# Patient Record
Sex: Male | Born: 1970 | Race: White | Hispanic: No | Marital: Married | Smoking: Former smoker
Health system: Southern US, Community
[De-identification: ages and names within clinical notes are randomized; demographics above are authoritative.]

## PROBLEM LIST (undated history)

## (undated) HISTORY — PX: HERNIA REPAIR: SHX51

---

## 2005-11-16 ENCOUNTER — Ambulatory Visit (HOSPITAL_COMMUNITY): Admission: RE | Admit: 2005-11-16 | Discharge: 2005-11-16 | Payer: Self-pay | Admitting: General Surgery

## 2007-07-04 ENCOUNTER — Ambulatory Visit: Payer: Self-pay | Admitting: Orthopedic Surgery

## 2007-07-04 DIAGNOSIS — M25569 Pain in unspecified knee: Secondary | ICD-10-CM | POA: Insufficient documentation

## 2007-07-07 ENCOUNTER — Telehealth: Payer: Self-pay | Admitting: Orthopedic Surgery

## 2010-11-14 NOTE — H&P (Signed)
Christopher Bush, Christopher Bush                 ACCOUNT NO.:  [id number]   MEDICAL RECORD NO.:  [id number]          PATIENT TYPE:  AMB   LOCATION:                                FACILITY:  [facility]   PHYSICIAN:  Dalia Heading, M.D.  DATE OF BIRTH:  09/20/2003   DATE OF ADMISSION:  11/16/2005  DATE OF DISCHARGE:  LH                                HISTORY & PHYSICAL   CHIEF COMPLAINT:  Recurrent cyst, left ear.   HISTORY OF PRESENT ILLNESS:  The patient is a 40 year old white male who  presents with a recurrent cyst behind his left ear.  He has had 2 attempts  at removal by another surgeon in his office in the past.  The cyst has  recurred.   PAST MEDICAL HISTORY:  Unremarkable.   PAST SURGICAL HISTORY:  As noted above.   CURRENT MEDICATIONS:  Keflex.   ALLERGIES:  No known drug allergies.   REVIEW OF SYSTEMS:  Noncontributory.   PHYSICAL EXAMINATION:  GENERAL:  On physical examination, the patient is a  well-developed, well-nourished white male in no acute distress.  LUNGS:  Clear to auscultation with equal breath sounds bilaterally.  HEART EXAMINATION:  Reveals A regular rate and rhythm without S3-S4 or  murmurs.  HEENT EXAMINATION:  Reveals a 1.5 cm inflamed cyst on the posterior aspect  of the left ear.  Erythema is noted.  No drainage is noted.   IMPRESSION:  Recurrent cyst, left ear.   PLAN:  The patient is scheduled for excision of the recurrent cyst, left ear  on 11/16/2005.  The risks and benefits of the procedure including recurrence  of the cyst were fully explained to the patient, who gave informed consent.      Dalia Heading, M.D.  Electronically Signed     MAJ/MEDQ  D:  11/10/2005  T:  11/10/2005  Job:  [id number]   cc:   [facility]  Fax: [phone]   Patrica Duel, M.D.  Fax: [phone]

## 2010-11-14 NOTE — Op Note (Signed)
NAMEGEORDIE, Christopher Bush                 ACCOUNT NO.:  [id number]   MEDICAL RECORD NO.:  [id number]          PATIENT TYPE:  AMB   LOCATION:  DAY                           FACILITY:  [facility]   PHYSICIAN:  Dalia Heading, M.D.  DATE OF BIRTH:  1970/07/10   DATE OF PROCEDURE:  11/16/2005  DATE OF DISCHARGE:                                 OPERATIVE REPORT   AGE:  40 years old.   PREOPERATIVE DIAGNOSIS:  Recurrent cyst, left ear.   POSTOPERATIVE DIAGNOSIS:  Recurrent cyst, left ear.   PROCEDURE:  Excision of recurrent cyst, left ear.   SURGEON:  Dr. Franky Macho.   ANESTHESIA:  General endotracheal.   INDICATIONS:  The patient is a 40 year old white male, who has had recurrent  episodes of drainage from a cyst in the posterior auricular region of the  left earlobe.  Due to the recurrent nature and failure of previous removals,  the patient now comes to the operating room for a formal excision of the  recurrent cyst, left ear.  The risks and benefits of the procedure,  including bleeding, infection, and recurrence of the cyst were fully  explained to the patient, who gave informed consent.   PROCEDURE NOTE:  The patient was placed in the supine position.  After  induction of general endotracheal anesthesia, the left posterior auricular  region was prepped and draped using the usual sterile technique with  Betadine.  Surgical site confirmation was performed.   An elliptical incision was made over the region of the cyst in order to  remove excess tissue and scar tissue.  Cyst remnants were noted.  These were  curetted and removed without difficulty.  Any bleeding was controlled using  Bovie electrocautery.  Then, 0.5 % Sensorcaine was instilled into the  surrounding region.  The incision was closed using 5-0 nylon interrupted  sutures.  Neosporin ointment and a dry sterile dressing were applied.   All tape and needle counts were correct at the end of the procedure.  The  patient was  extubated in the operating room and went back to the recovery  room, awake and in stable condition.   COMPLICATIONS:  None.   SPECIMEN:  None.   BLOOD LOSS:  Minimal.      Dalia Heading, M.D.  Electronically Signed     MAJ/MEDQ  D:  11/16/2005  T:  11/16/2005  Job:  [id number]   cc:   Patrica Duel, M.D.  Fax: [phone]

## 2013-07-22 ENCOUNTER — Emergency Department (HOSPITAL_COMMUNITY)
Admission: EM | Admit: 2013-07-22 | Discharge: 2013-07-22 | Disposition: A | Discharge status: Home / Self Care | Payer: BC Managed Care – PPO | Attending: Emergency Medicine | Admitting: Emergency Medicine

## 2013-07-22 ENCOUNTER — Encounter (HOSPITAL_COMMUNITY): Payer: Self-pay | Admitting: Emergency Medicine

## 2013-07-22 DIAGNOSIS — R591 Generalized enlarged lymph nodes: Secondary | ICD-10-CM

## 2013-07-22 DIAGNOSIS — R599 Enlarged lymph nodes, unspecified: Secondary | ICD-10-CM | POA: Insufficient documentation

## 2013-07-22 DIAGNOSIS — Z79899 Other long term (current) drug therapy: Secondary | ICD-10-CM | POA: Insufficient documentation

## 2013-07-22 LAB — CBC WITH DIFFERENTIAL/PLATELET
Basophils Absolute: 0 10*3/uL (ref 0.0–0.1)
Basophils Relative: 0 % (ref 0–1)
Eosinophils Absolute: 0.1 10*3/uL (ref 0.0–0.7)
Eosinophils Relative: 1 % (ref 0–5)
HCT: 42.5 % (ref 39.0–52.0)
HEMOGLOBIN: 15.3 g/dL (ref 13.0–17.0)
LYMPHS ABS: 0.6 10*3/uL — AB (ref 0.7–4.0)
Lymphocytes Relative: 10 % — ABNORMAL LOW (ref 12–46)
MCH: 31.9 pg (ref 26.0–34.0)
MCHC: 36 g/dL (ref 30.0–36.0)
MCV: 88.7 fL (ref 78.0–100.0)
Monocytes Absolute: 0.6 10*3/uL (ref 0.1–1.0)
Monocytes Relative: 10 % (ref 3–12)
NEUTROS PCT: 79 % — AB (ref 43–77)
Neutro Abs: 4.8 10*3/uL (ref 1.7–7.7)
PLATELETS: 171 10*3/uL (ref 150–400)
RBC: 4.79 MIL/uL (ref 4.22–5.81)
RDW: 11.8 % (ref 11.5–15.5)
WBC: 6.1 10*3/uL (ref 4.0–10.5)

## 2013-07-22 LAB — MONONUCLEOSIS SCREEN: MONO SCREEN: NEGATIVE

## 2013-07-22 NOTE — ED Provider Notes (Signed)
Medical screening examination/treatment/procedure(s) were performed by non-physician practitioner and as supervising physician I was immediately available for consultation/collaboration.  EKG Interpretation   None         Glynn OctaveStephen Anselm Aumiller, MD 07/22/13 [phone]

## 2013-07-22 NOTE — ED Provider Notes (Signed)
CSN: [id number]     Arrival date & time 07/22/13  [id number] History   First MD Initiated Contact with Patient 07/22/13 [phone]     Chief Complaint  Patient presents with  . Sore Throat   (Consider location/radiation/quality/duration/timing/severity/associated sxs/prior Treatment) HPI Comments: Patient presents with complaint of neck swelling and sore throat for the past day. Preceding this, patient had upper respiratory tract infection with sore throat starting 9 days ago. Patient saw his doctor 1 week ago and was prescribed an antibiotic, cough medication, steroid shot. Over the next 3 days the patient's symptoms improved until yesterday. Patient denies fever, weight loss, night sweats, swelling in other locations. No shortness of breath or trouble breathing. Patient states that he feels pressure when he swallows. No regurgitation. Patient saw his primary care doctor today and received another steroid shot and prescription for prednisone which he has not started taking yet. Onset of symptoms gradual. Course is gradually worsening.  Patient is a 43 y.o. male presenting with pharyngitis. The history is provided by the patient.  Sore Throat Associated symptoms include a sore throat. Pertinent negatives include no abdominal pain, chest pain, coughing, fever, headaches, myalgias, nausea, rash or vomiting.    History reviewed. No pertinent past medical history. History reviewed. No pertinent past surgical history. No family history on file. History  Substance Use Topics  . Smoking status: Never Smoker   . Smokeless tobacco: Not on file  . Alcohol Use: No    Review of Systems  Constitutional: Negative for fever.  HENT: Positive for sore throat and trouble swallowing (pressure sensation). Negative for rhinorrhea.   Eyes: Negative for redness.  Respiratory: Negative for cough.   Cardiovascular: Negative for chest pain.  Gastrointestinal: Negative for nausea, vomiting, abdominal pain and diarrhea.   Genitourinary: Negative for dysuria.  Musculoskeletal: Negative for myalgias.  Skin: Negative for rash.  Neurological: Negative for headaches.  Hematological: Positive for adenopathy.    Allergies  Review of patient's allergies indicates no known allergies.  Home Medications   Current Outpatient Rx  Name  Route  Sig  Dispense  Refill  . Iron TABS   Oral   Take 1 tablet by mouth daily.          BP 133/92  Pulse 78  Temp(Src) 98.3 F (36.8 C) (Oral)  Resp 16  Ht 5\' 10"  (1.778 m)  Wt 192 lb (87.091 kg)  BMI 27.55 kg/m2  SpO2 100% Physical Exam  Nursing note and vitals reviewed. Constitutional: He appears well-developed and well-nourished.  HENT:  Head: Normocephalic and atraumatic.  Right Ear: Tympanic membrane, external ear and ear canal normal.  Left Ear: Tympanic membrane, external ear and ear canal normal.  Nose: Nose normal. No mucosal edema or rhinorrhea.  Mouth/Throat: Uvula is midline, oropharynx is clear and moist and mucous membranes are normal. Mucous membranes are not dry. No trismus in the jaw. No uvula swelling. No oropharyngeal exudate, posterior oropharyngeal edema, posterior oropharyngeal erythema or tonsillar abscesses.  Eyes: Conjunctivae are normal. Right eye exhibits no discharge. Left eye exhibits no discharge.  Neck: Normal range of motion. Neck supple.  Cardiovascular: Normal rate, regular rhythm and normal heart sounds.   Pulmonary/Chest: Effort normal and breath sounds normal. No respiratory distress. He has no wheezes. He has no rales.  Abdominal: Soft. There is no tenderness.  Lymphadenopathy:       Head (right side): Tonsillar adenopathy present. No submental, no submandibular, no preauricular, no posterior auricular and no occipital adenopathy present.  Head (left side): Tonsillar adenopathy present. No submental, no submandibular, no preauricular, no posterior auricular and no occipital adenopathy present.    He has no cervical  adenopathy.    He has no axillary adenopathy.       Right axillary: No pectoral and no lateral adenopathy present.       Left axillary: No pectoral and no lateral adenopathy present.      Right: No inguinal, no supraclavicular and no epitrochlear adenopathy present.       Left: No inguinal, no supraclavicular and no epitrochlear adenopathy present.  Neurological: He is alert.  Skin: Skin is warm and dry.  Psychiatric: He has a normal mood and affect.    ED Course  Procedures (including critical care time) Labs Review Labs Reviewed  CBC WITH DIFFERENTIAL - Abnormal; Notable for the following:    Neutrophils Relative % 79 (*)    Lymphocytes Relative 10 (*)    Lymphs Abs 0.6 (*)    All other components within normal limits  MONONUCLEOSIS SCREEN   Imaging Review No results found.  EKG Interpretation   None      10:02 AM Patient seen and examined. Work-up initiated.   Vital signs reviewed and are as follows: Filed Vitals:   07/22/13 0933  BP: 133/92  Pulse: 78  Temp: 98.3 F (36.8 C)  Resp: 16   1:13 PM Patient and findings d/w Dr. Manus Gunning. Patient's exam is stable. No impending airway compromise suspected. Patient informed of results. He plans to followup with primary care physician in 2 days. Patient urged to return with worsening, worsening difficulty swallowing, pressure in his neck or sensation of not being able to breathe, fever, worsening swelling. Patient verbalizes understanding and agrees with plan. Patient will take steroids provided by PCP.   MDM   1. Lymphadenopathy    Patient with enlarged tonsillar nodes. No concern for Ludwig's angina as patient does not have fever or risk factors for this. No stridor. No concern for epiglottitis.  No respiratory distress objectively or subjectively. Patient is able to swallow. No other chains of lymph nodes involved. Lymph nodes are likely reactive. Patient to continue close monitoring at home and followup as needed.  No  dangerous or life-threatening conditions suspected or identified by history, physical exam, and by work-up. No indications for hospitalization identified.        Renne Crigler, PA-C 07/22/13 1316

## 2013-07-22 NOTE — ED Notes (Addendum)
Sent here from MD's office. C/o sore throat & swollen glands in neck since yesterday getting larger.  Denies SOB or breathing difficulties. States received a shot of steroids at office PTA

## 2013-07-22 NOTE — Discharge Instructions (Signed)
Please read and follow all provided instructions.  Your diagnoses today include:  1. Lymphadenopathy     Tests performed today include:  Mono test: was NEGATIVE   Blood counts and electrolytes: no concerning findings  Vital signs. See below for your results today.   Medications prescribed:  Take any medications prescribed only as directed.   Home care instructions:  Please read the educational materials provided and follow any instructions contained in this packet.  Follow-up instructions: Please follow-up with your primary care provider in 2 days for further evaluation of your symptoms.  Take medications prescribed previously by your doctor as directed.   If you do not have a primary care doctor -- see below for referral information.   Return instructions:   Please return to the Emergency Department if you experience worsening symptoms.   Return if you are not able to swallow, your neck becomes more swollen or red, you cannot swallow your saliva or your voice becomes muffled.   Return with high persistent fever, persistent vomiting, or if you have trouble breathing.   Please return if you have any other emergent concerns.  Additional Information:  Your vital signs today were: BP 133/92   Pulse 78   Temp(Src) 98.3 F (36.8 C) (Oral)   Resp 16   Ht 5\' 10"  (1.778 m)   Wt 192 lb (87.091 kg)   BMI 27.55 kg/m2   SpO2 100% If your blood pressure (BP) was elevated above 135/85 this visit, please have this repeated by your doctor within one month. --------------  Emergency Department Resource Guide 1) Find a Doctor and Pay Out of Pocket Although you won't have to find out who is covered by your insurance plan, it is a good idea to ask around and get recommendations. You will then need to call the office and see if the doctor you have chosen will accept you as a new patient and what types of options they offer for patients who are self-pay. Some doctors offer discounts or will  set up payment plans for their patients who do not have insurance, but you will need to ask so you aren't surprised when you get to your appointment.  2) Contact Your Local Health Department Not all health departments have doctors that can see patients for sick visits, but many do, so it is worth a call to see if yours does. If you don't know where your local health department is, you can check in your phone book. The CDC also has a tool to help you locate your state's health department, and many state websites also have listings of all of their local health departments.  3) Find a Walk-in Clinic If your illness is not likely to be very severe or complicated, you may want to try a walk in clinic. These are popping up all over the country in pharmacies, drugstores, and shopping centers. They're usually staffed by nurse practitioners or physician assistants that have been trained to treat common illnesses and complaints. They're usually fairly quick and inexpensive. However, if you have serious medical issues or chronic medical problems, these are probably not your best option.  No Primary Care Doctor: - Call Health Connect at  [phone] - they can help you locate a primary care doctor that  accepts your insurance, provides certain services, etc. - Physician Referral Service- [phone]  Chronic Pain Problems: Organization         Address  Phone   Notes  [facility]  (  [phone] Patients need to be referred by their primary care doctor.   Medication Assistance: Organization         Address  Phone   Notes  [facility] [address] [place], [place] [address] [phone] --Must be a resident of [facility] -- Must have NO insurance coverage whatsoever (no Medicaid/ Medicare, etc.) -- The pt. MUST have a primary care doctor that directs their care regularly and follows them in the community   [facility]ist  [phone]    [employer]  [phone]    Agencies that provide inexpensive medical care: Organization         Address  Phone   Notes  [facility]  [phone]   [facility]    [phone]   [facility] [address] [place], [place] [address] [phone]   [facility] [address] [place] [place] [phone]   Planned Parenthood    [phone]   [facility]    [phone]   Community [facility] and [place]  [address], [place] Phone:  [phone], Fax:  [phone] Hours of Operation:  9 am - 6 pm, M-F.  Also accepts Medicaid/Medicare and self-pay.  [facility]  [address], [place] Phone: [phone], Fax: [phone]. Hours of Operation:  8:30 am - 5:30 pm, M-F.  Also accepts Medicaid and self-pay.  [facility] [place] [address] [place] Phone: [phone]   [facility] [address] Natasha Bence [place], [place] [phone], Ext. [phone] Mondays & Thursdays: 7-9 AM.  First 15 patients are seen on a first come, first serve basis.    Medicaid-accepting [facility] Providers:  Organization         Address  Phone   Notes  [facility] [address],  [phone] Also accepts self-pay patients.  [facility] [address] Laurell Josephs [place], [place]  [phone]   [facility] [address], [place] [phone]   [facility] [address], [place] [phone]   Renaye Rakers [address], [place]   [phone] Only accepts [place] Access [place] patients after they have their name applied to their card.   Self-Pay (no insurance) in [facility]:  Organization         Address  Phone   Notes  Sickle Cell Patients, [facility] [address] [place],  [place] [phone]   [facility] [address] [place], [place] [phone]   [facility] [place]  [address], [place] [phone]   [facility]/Dr. Osei-Bonsu  [address] [place] or [address], [place] [phone] Phone number for both [place] and [place] locations is the same.  [facility]gent [facility] and [facility] [address], [place] [phone]   [facility] [address] [place] or [address] [phone]   [facility] [address] [place] [phone], phone; [phone], fax Sees patients 1st and 3rd Saturday of every month.  Must not qualify for public or private insurance (i.e. Medicaid, Medicare, [place] Health Choice, Veterans' Benefits)  Household income should be no more than 200% of the poverty level The clinic cannot treat you if you are pregnant or think you are pregnant  Sexually transmitted diseases are not treated at the clinic.    Dental Care: Organization         Address  Phone  Notes  [facility] [address], [place] [phone] Accepts children up to age 41 who are enrolled in [place] or [place] Health Choice; pregnant women with a Medicaid card; and children who have applied for Medicaid or [place] Health Choice, but were declined, whose parents can pay a reduced fee at time of service.  [facility] [place]  [address], [place] [phone]) [phone] Accepts children up to age 41 who are enrolled in [place] or [place] Health Choice; pregnant women with a Medicaid card; and children who have applied for Medicaid or [place] Health Choice, but were declined, whose parents can pay a reduced fee at time of service.  [facility]  [address], [place] [phone]  [phone] Patients are seen by appointment only. Walk-ins are not accepted. [facility] will see patients 41 years of age and older. Monday - Tuesday (8am-5pm) Most Wednesdays (8:30-5pm) $30 per visit, cash only  [facility]  [address], [facility] [phone] Patients are seen by appointment only. Walk-ins are not accepted. [facility] will see patients 918 years of age and older. One Wednesday Evening (Monthly: Volunteer Based).  $30 per visit, cash only  [employer] [facility] [facility]  [phone]) [phone] for adults; Children under age 554, call Graduate Pediatric Dentistry at [phone]. Children aged 284-14, please call [phone]) [phone] to request a pediatric application.  Dental services are provided in all areas of dental care including fillings, crowns and bridges, complete and partial dentures, implants, gum treatment, root canals, and extractions. Preventive care is also provided. Treatment is provided to both adults and children. Patients are selected via a lottery and there is often a waiting list.   [facility] [address], [place]  [phone] [email or web address]   [facility] [address], [place], [place] [phone], Ext. [phone] Second and Fourth Thursday of each month, opens at 6:30 AM; Clinic ends at 9 AM.  Patients are seen on a first-come first-served basis, and a limited number are seen during each clinic.   [facility]  [address], [place], [place] [phone]) [phone]   Eligibility Requirements You must have lived in [place], [place], or [place] counties for at least the last three months.   You cannot be eligible for state or federal sponsored [place]healthcare insurance, including CIGNAVeterans Administration, [place]icaid, or [employer]icare.   You generally cannot be eligible for healthcare insurance through your employer.    How to apply: Eligibility screenings are held every Tuesday and Wednesday afternoon  from 1:00 pm until 4:00 pm. You do not need an appointment for the interview!  [facility] [address], [place], [place] [phone]   [facility]  [phone]   [facility]  [phone]   [facility]  [phone]    Behavioral Health Resources in the Community: Intensive Outpatient Programs Organization         Address  Phone  Notes  [facility] [address], [place],  [place] [phone]   [facility] Outpatient [address], [place], [place]   [facility]: [facility] [address], [place], [place]   [place] [address]  [place], [place] or [phone]   Substance Abuse Resources Organization         Address  Phone  Notes  Alcohol [facility] Drug Services  [phone]   [place]  [phone]   [facility]   Chinita Pester  [phone]   Residential & Outpatient Substance Abuse Program  [phone]   Psychological Services Organization         Address  Phone  Notes  [facility] [place]  [place]  [phone]   [place] [address], [place] or [phone]    Mobile Crisis Teams Organization         Address  Phone  Notes  [facility], Mobile Crisis Care Unit  [phone]   [facility]  [address] [place], [place]   [place] [address], [place] [phone]    Self-Help/Support Groups Organization         Address  Phone             Notes  [place] of [place] - variety of support groups  [place] Call for more information  Narcotics Anonymous (NA), [facility] [address], [employer] [place]  2 meetings at this location   Materials engineer         Address  Phone  Notes  [facility] [place],    [place]  [phone]   [facility]  [address], [place] [phone], [place], [place]   [place], [place] [phone] Admissions: 8am-3pm M-F  Incentives Substance [place] [address]    [place], [place] [phone]   [facility] [address] [place], [place], [place]   [facility] [address]  [place], [place]   [facility] - Intensive Outpatient [address], [place], [place]   [facility] ([place].,  [place], [place] [phone] or [phone]   [facility] (RT[facility]) [address], [place], [place] Accepts Medicaid  [facility] [place] [address]  [place] [phone] Substance Abuse/Addiction Treatment   [facility] Organization         Address  Phone  Notes  [facility]  [phone]   Domenic Schwab, PhD [address] Arlis Porta [place], [place]   [phone] or [phone]   [facility] [place] [address] [place], [place] [phone]   [facility] [address], [place], [place] [phone] Insurance/Medicaid/sponsorship through [facility] and Families [address]                                    [place], [place] [phone] [place] [address], [place] [phone]    Dr. Adele Schilder  [phone]   [facility] Dept. 1) [address], [place] [address]) [address], [place] °3)  [address], [place] [phone] °[phone] ° °[phone]   °[place] Child Abuse Hotline [phone]) [phone] or [phone] (After Hours)    ° ° °

## 2015-09-10 ENCOUNTER — Other Ambulatory Visit (HOSPITAL_COMMUNITY): Payer: Self-pay | Admitting: Internal Medicine

## 2015-09-10 DIAGNOSIS — E291 Testicular hypofunction: Secondary | ICD-10-CM

## 2015-09-18 ENCOUNTER — Ambulatory Visit (HOSPITAL_COMMUNITY)
Admission: RE | Admit: 2015-09-18 | Discharge: 2015-09-18 | Disposition: A | Discharge status: Home / Self Care | Payer: BLUE CROSS/BLUE SHIELD | Source: Ambulatory Visit | Attending: Internal Medicine | Admitting: Internal Medicine

## 2015-09-18 DIAGNOSIS — J328 Other chronic sinusitis: Secondary | ICD-10-CM | POA: Insufficient documentation

## 2015-09-18 DIAGNOSIS — H7092 Unspecified mastoiditis, left ear: Secondary | ICD-10-CM | POA: Diagnosis not present

## 2015-09-18 DIAGNOSIS — E291 Testicular hypofunction: Secondary | ICD-10-CM | POA: Diagnosis present

## 2015-11-05 DIAGNOSIS — F3340 Major depressive disorder, recurrent, in remission, unspecified: Secondary | ICD-10-CM | POA: Diagnosis not present

## 2015-11-07 DIAGNOSIS — E291 Testicular hypofunction: Secondary | ICD-10-CM | POA: Diagnosis not present

## 2015-11-12 DIAGNOSIS — H0014 Chalazion left upper eyelid: Secondary | ICD-10-CM | POA: Diagnosis not present

## 2015-12-18 DIAGNOSIS — E291 Testicular hypofunction: Secondary | ICD-10-CM | POA: Diagnosis not present

## 2015-12-18 DIAGNOSIS — H0014 Chalazion left upper eyelid: Secondary | ICD-10-CM | POA: Diagnosis not present

## 2015-12-18 DIAGNOSIS — R5383 Other fatigue: Secondary | ICD-10-CM | POA: Diagnosis not present

## 2015-12-24 DIAGNOSIS — H524 Presbyopia: Secondary | ICD-10-CM | POA: Diagnosis not present

## 2015-12-24 DIAGNOSIS — H52223 Regular astigmatism, bilateral: Secondary | ICD-10-CM | POA: Diagnosis not present

## 2015-12-24 DIAGNOSIS — H5213 Myopia, bilateral: Secondary | ICD-10-CM | POA: Diagnosis not present

## 2016-01-14 DIAGNOSIS — H0014 Chalazion left upper eyelid: Secondary | ICD-10-CM | POA: Diagnosis not present

## 2016-02-18 DIAGNOSIS — F3340 Major depressive disorder, recurrent, in remission, unspecified: Secondary | ICD-10-CM | POA: Diagnosis not present

## 2016-06-15 DIAGNOSIS — F3340 Major depressive disorder, recurrent, in remission, unspecified: Secondary | ICD-10-CM | POA: Diagnosis not present

## 2016-06-17 DIAGNOSIS — E559 Vitamin D deficiency, unspecified: Secondary | ICD-10-CM | POA: Diagnosis not present

## 2016-06-17 DIAGNOSIS — E291 Testicular hypofunction: Secondary | ICD-10-CM | POA: Diagnosis not present

## 2016-06-17 IMAGING — MR MR HEAD W/O CM
6 of 11 series · 20 of 48 positions shown · non-contrast
Comparison: None.

CLINICAL DATA: 44-year-old male with headaches, fatigue.
Hypogonadism. Initial encounter.

EXAM:
MRI HEAD WITHOUT CONTRAST
TECHNIQUE: Multiplanar, multiecho pulse sequences of the brain and surrounding
structures were obtained without intravenous contrast.

[Series 2: T1 · sagittal · 5.0mm · 0.43mm/px · 1 of 21 slices shown (1 of 2)]
[im 1/21]
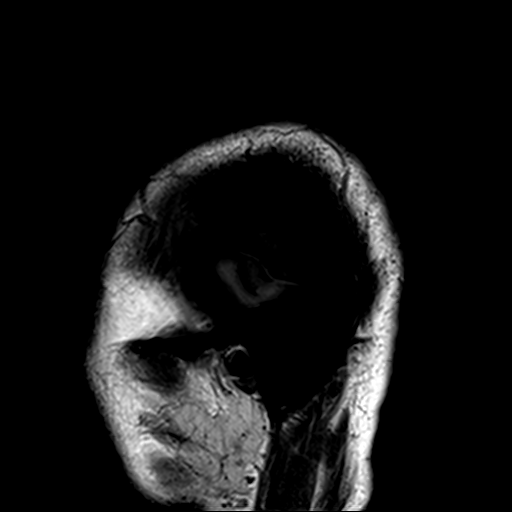

[Series 5: T2 · axial · 5.0mm · 0.49mm/px · z∈[-53,+90]mm · 2 of 23 slices shown (1 of 2)]
[im 1/23]
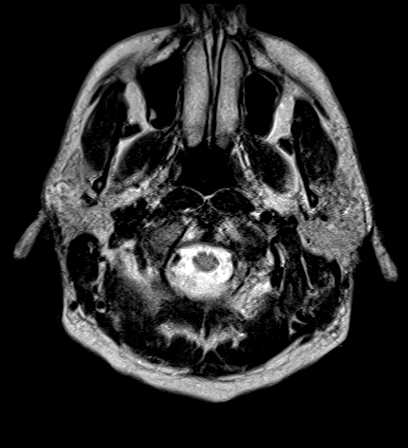
[im 23/23]
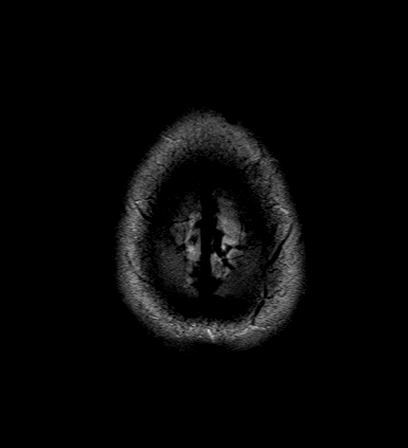

[Series 6: FLAIR · axial · 5.0mm · 0.36mm/px · z∈[-53,+90]mm · 3 of 23 slices shown]
[im 1/23]
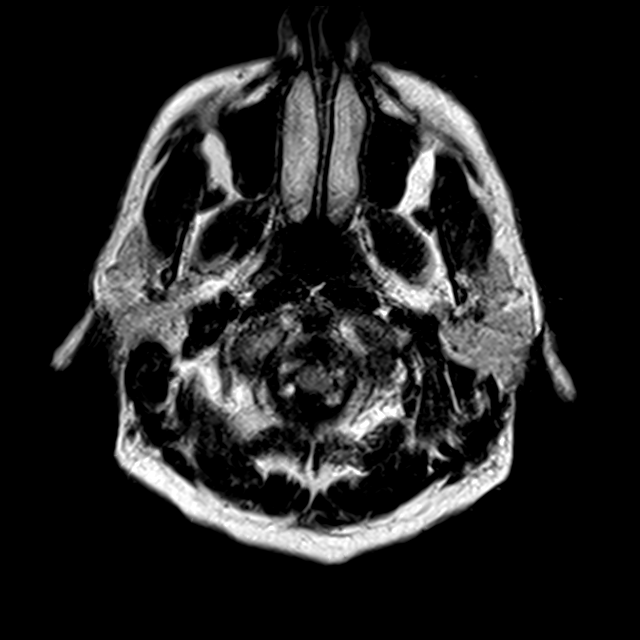
[im 12/23]
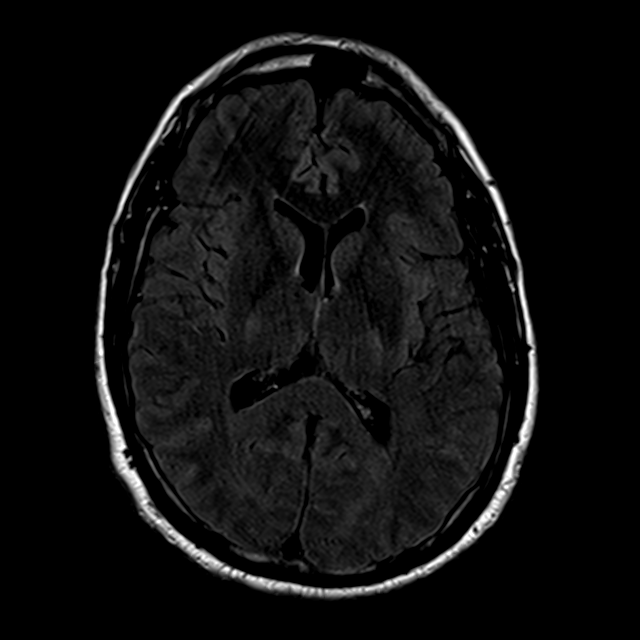
[im 23/23]
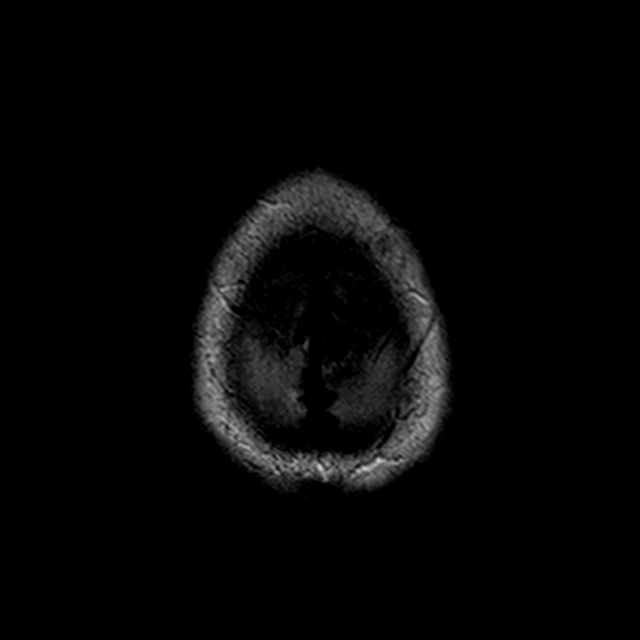

[Series 7: T1 · axial · 2.0mm · 0.44mm/px · z∈[-52,+101]mm · 8 of 78 slices shown (2 of 2)]
[im 1/78]
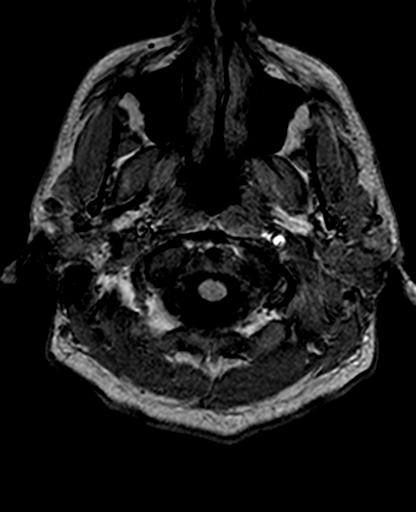
[im 10/78]
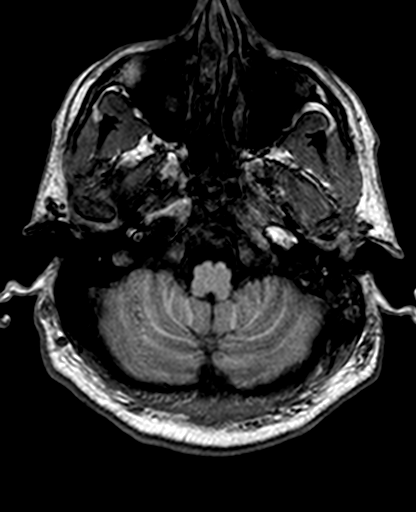
[im 20/78]
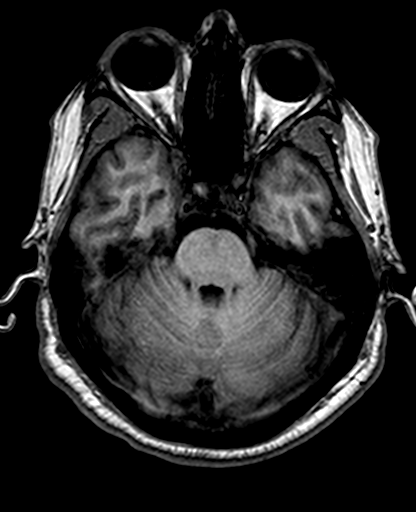
[im 29/78]
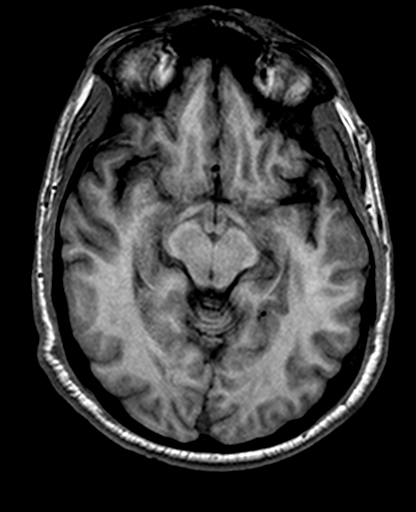
[im 49/78]
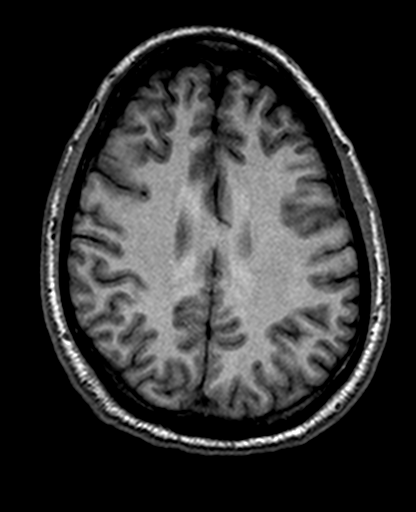
[im 58/78]
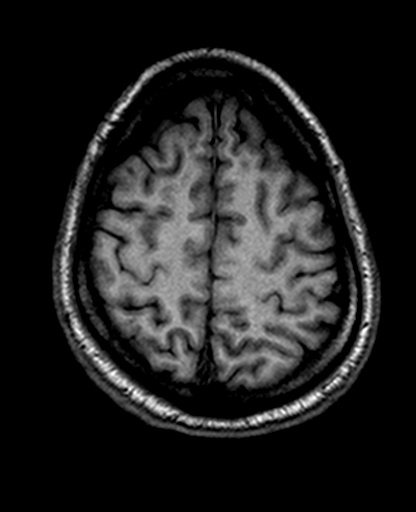
[im 68/78]
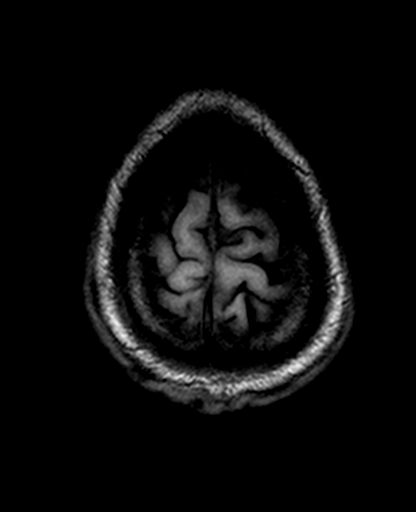
[im 78/78]
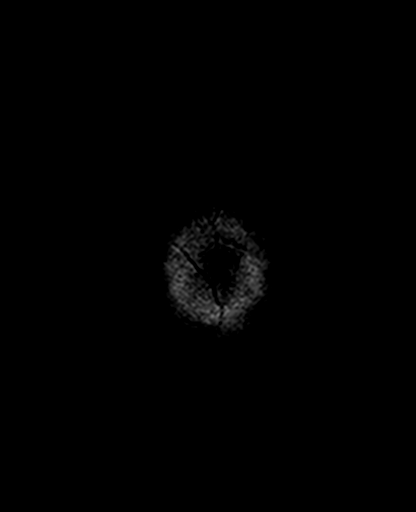

[Series 8: trauma axial · axial · 5.0mm · 0.42mm/px · z∈[-53,+90]mm · 3 of 23 slices shown]
[im 1/23]
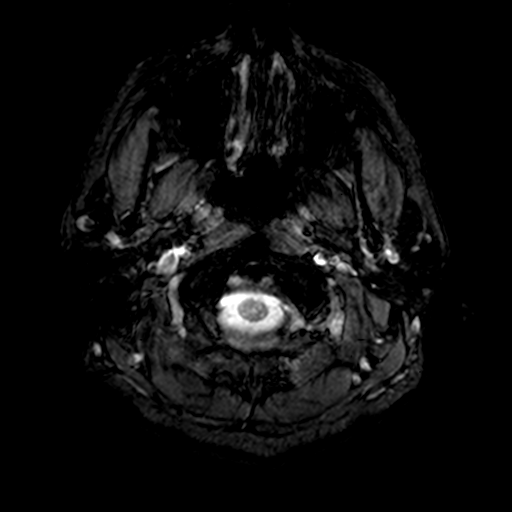
[im 12/23]
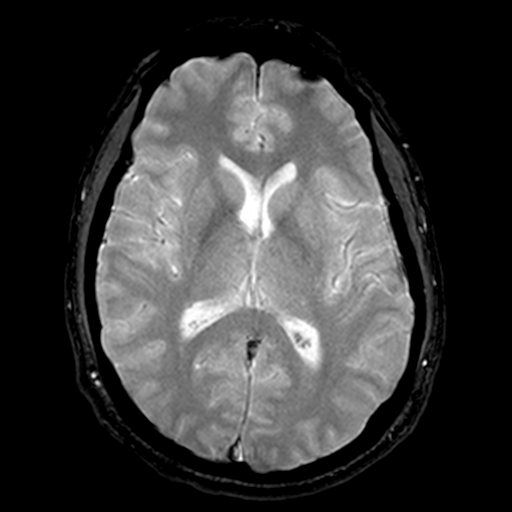
[im 23/23]
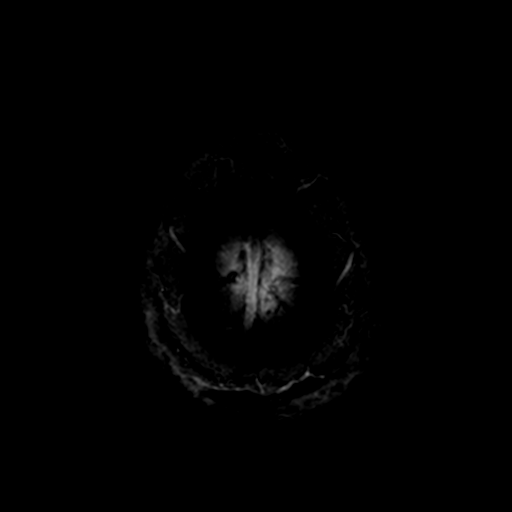

[Series 9: T2 · coronal · 5.0mm · 0.43mm/px · 3 of 28 slices shown (2 of 2)]
[im 1/28]
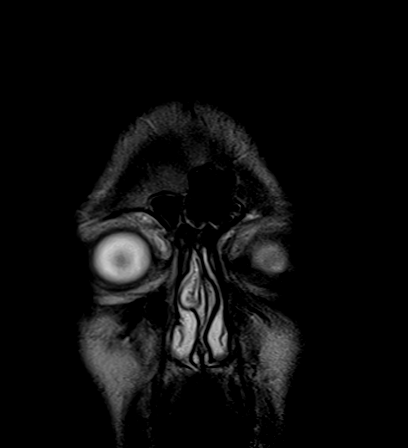
[im 14/28]
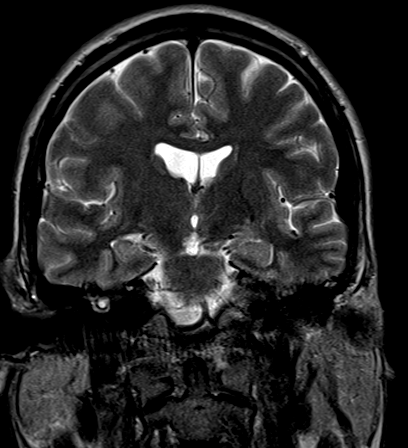
[im 28/28]
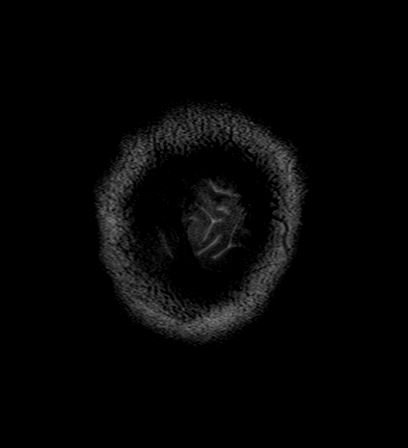

[20 of 48 positions shown; findings below may reference images not displayed]

FINDINGS: Cerebral volume is within normal limits. No restricted diffusion to
suggest acute infarction. No midline shift, mass effect, evidence of
mass lesion, ventriculomegaly, extra-axial collection or acute
intracranial hemorrhage. Cervicomedullary junction within normal
limits. Major intracranial vascular flow voids are within normal
limits.

No thin slice pituitary imaging was performed, however, the
pituitary appears diminutive (series 2, image 11) without evidence
of pituitary mass.

Gray and white matter signal is within normal limits throughout the
brain. No encephalomalacia or chronic cerebral blood products.

Left mastoid effusion. Negative nasopharynx. Right mastoids are
clear. Small mucous retention cyst right maxillary sinus. Trace
ethmoid sinus mucosal thickening. Negative orbit and scalp soft
tissues. Negative visualized cervical spine. Normal bone marrow
signal.
IMPRESSION: 1. Unremarkable noncontrast MRI appearance of the brain. Pituitary
gland appears somewhat diminutive, without evidence of pituitary
mass.
2. Left mastoid effusion. Most often these are postinflammatory.
Minimal to mild paranasal sinus inflammation.

## 2016-08-12 DIAGNOSIS — E559 Vitamin D deficiency, unspecified: Secondary | ICD-10-CM | POA: Diagnosis not present

## 2016-08-12 DIAGNOSIS — R5383 Other fatigue: Secondary | ICD-10-CM | POA: Diagnosis not present

## 2016-08-12 DIAGNOSIS — Z0389 Encounter for observation for other suspected diseases and conditions ruled out: Secondary | ICD-10-CM | POA: Diagnosis not present

## 2016-08-12 DIAGNOSIS — E291 Testicular hypofunction: Secondary | ICD-10-CM | POA: Diagnosis not present

## 2016-08-12 DIAGNOSIS — Z125 Encounter for screening for malignant neoplasm of prostate: Secondary | ICD-10-CM | POA: Diagnosis not present

## 2016-08-12 DIAGNOSIS — Z0000 Encounter for general adult medical examination without abnormal findings: Secondary | ICD-10-CM | POA: Diagnosis not present

## 2016-11-13 DIAGNOSIS — F3340 Major depressive disorder, recurrent, in remission, unspecified: Secondary | ICD-10-CM | POA: Diagnosis not present

## 2017-03-18 DIAGNOSIS — F3340 Major depressive disorder, recurrent, in remission, unspecified: Secondary | ICD-10-CM | POA: Diagnosis not present

## 2017-05-31 DIAGNOSIS — H52223 Regular astigmatism, bilateral: Secondary | ICD-10-CM | POA: Diagnosis not present

## 2017-05-31 DIAGNOSIS — H5213 Myopia, bilateral: Secondary | ICD-10-CM | POA: Diagnosis not present

## 2017-05-31 DIAGNOSIS — H524 Presbyopia: Secondary | ICD-10-CM | POA: Diagnosis not present

## 2017-07-26 DIAGNOSIS — F3340 Major depressive disorder, recurrent, in remission, unspecified: Secondary | ICD-10-CM | POA: Diagnosis not present

## 2017-09-09 DIAGNOSIS — L820 Inflamed seborrheic keratosis: Secondary | ICD-10-CM | POA: Diagnosis not present

## 2017-09-09 DIAGNOSIS — L218 Other seborrheic dermatitis: Secondary | ICD-10-CM | POA: Diagnosis not present

## 2017-09-09 DIAGNOSIS — D225 Melanocytic nevi of trunk: Secondary | ICD-10-CM | POA: Diagnosis not present

## 2017-09-22 DIAGNOSIS — E559 Vitamin D deficiency, unspecified: Secondary | ICD-10-CM | POA: Diagnosis not present

## 2017-09-22 DIAGNOSIS — R5383 Other fatigue: Secondary | ICD-10-CM | POA: Diagnosis not present

## 2017-09-22 DIAGNOSIS — N19 Unspecified kidney failure: Secondary | ICD-10-CM | POA: Diagnosis not present

## 2017-09-22 DIAGNOSIS — Z8659 Personal history of other mental and behavioral disorders: Secondary | ICD-10-CM | POA: Diagnosis not present

## 2017-09-22 DIAGNOSIS — E291 Testicular hypofunction: Secondary | ICD-10-CM | POA: Diagnosis not present

## 2017-11-01 DIAGNOSIS — R5383 Other fatigue: Secondary | ICD-10-CM | POA: Diagnosis not present

## 2017-11-01 DIAGNOSIS — E559 Vitamin D deficiency, unspecified: Secondary | ICD-10-CM | POA: Diagnosis not present

## 2017-11-01 DIAGNOSIS — E291 Testicular hypofunction: Secondary | ICD-10-CM | POA: Diagnosis not present

## 2018-01-06 DIAGNOSIS — E559 Vitamin D deficiency, unspecified: Secondary | ICD-10-CM | POA: Diagnosis not present

## 2018-01-06 DIAGNOSIS — E291 Testicular hypofunction: Secondary | ICD-10-CM | POA: Diagnosis not present

## 2018-01-06 DIAGNOSIS — R5383 Other fatigue: Secondary | ICD-10-CM | POA: Diagnosis not present

## 2018-01-06 DIAGNOSIS — N19 Unspecified kidney failure: Secondary | ICD-10-CM | POA: Diagnosis not present

## 2018-01-10 DIAGNOSIS — F3340 Major depressive disorder, recurrent, in remission, unspecified: Secondary | ICD-10-CM | POA: Diagnosis not present

## 2018-04-11 DIAGNOSIS — Z23 Encounter for immunization: Secondary | ICD-10-CM | POA: Diagnosis not present

## 2018-04-11 DIAGNOSIS — R5383 Other fatigue: Secondary | ICD-10-CM | POA: Diagnosis not present

## 2018-04-11 DIAGNOSIS — E291 Testicular hypofunction: Secondary | ICD-10-CM | POA: Diagnosis not present

## 2018-04-11 DIAGNOSIS — N19 Unspecified kidney failure: Secondary | ICD-10-CM | POA: Diagnosis not present

## 2018-04-11 DIAGNOSIS — E669 Obesity, unspecified: Secondary | ICD-10-CM | POA: Diagnosis not present

## 2018-04-22 DIAGNOSIS — H0014 Chalazion left upper eyelid: Secondary | ICD-10-CM | POA: Diagnosis not present

## 2018-04-27 DIAGNOSIS — H5213 Myopia, bilateral: Secondary | ICD-10-CM | POA: Diagnosis not present

## 2018-04-27 DIAGNOSIS — H53021 Refractive amblyopia, right eye: Secondary | ICD-10-CM | POA: Diagnosis not present

## 2018-04-27 DIAGNOSIS — H52223 Regular astigmatism, bilateral: Secondary | ICD-10-CM | POA: Diagnosis not present

## 2018-04-27 DIAGNOSIS — H524 Presbyopia: Secondary | ICD-10-CM | POA: Diagnosis not present

## 2018-06-27 DIAGNOSIS — Z13220 Encounter for screening for lipoid disorders: Secondary | ICD-10-CM | POA: Diagnosis not present

## 2018-06-27 DIAGNOSIS — E669 Obesity, unspecified: Secondary | ICD-10-CM | POA: Diagnosis not present

## 2018-06-27 DIAGNOSIS — N19 Unspecified kidney failure: Secondary | ICD-10-CM | POA: Diagnosis not present

## 2018-06-27 DIAGNOSIS — Z125 Encounter for screening for malignant neoplasm of prostate: Secondary | ICD-10-CM | POA: Diagnosis not present

## 2018-06-27 DIAGNOSIS — Z0000 Encounter for general adult medical examination without abnormal findings: Secondary | ICD-10-CM | POA: Diagnosis not present

## 2018-06-27 DIAGNOSIS — E291 Testicular hypofunction: Secondary | ICD-10-CM | POA: Diagnosis not present

## 2018-06-27 DIAGNOSIS — E559 Vitamin D deficiency, unspecified: Secondary | ICD-10-CM | POA: Diagnosis not present

## 2018-06-27 DIAGNOSIS — R5383 Other fatigue: Secondary | ICD-10-CM | POA: Diagnosis not present

## 2018-07-04 DIAGNOSIS — F3340 Major depressive disorder, recurrent, in remission, unspecified: Secondary | ICD-10-CM | POA: Diagnosis not present

## 2018-07-28 ENCOUNTER — Ambulatory Visit (INDEPENDENT_AMBULATORY_CARE_PROVIDER_SITE_OTHER): Payer: BLUE CROSS/BLUE SHIELD | Admitting: Otolaryngology

## 2018-07-28 DIAGNOSIS — H9209 Otalgia, unspecified ear: Secondary | ICD-10-CM | POA: Diagnosis not present

## 2018-11-30 ENCOUNTER — Ambulatory Visit (INDEPENDENT_AMBULATORY_CARE_PROVIDER_SITE_OTHER): Payer: BLUE CROSS/BLUE SHIELD | Admitting: Internal Medicine

## 2019-06-28 ENCOUNTER — Ambulatory Visit (INDEPENDENT_AMBULATORY_CARE_PROVIDER_SITE_OTHER): Payer: Commercial Managed Care - PPO | Admitting: Internal Medicine

## 2019-06-28 ENCOUNTER — Other Ambulatory Visit: Payer: Self-pay

## 2019-06-28 ENCOUNTER — Encounter (INDEPENDENT_AMBULATORY_CARE_PROVIDER_SITE_OTHER): Payer: Self-pay | Admitting: Internal Medicine

## 2019-06-28 VITALS — BP 140/60 | HR 88 | Ht 70.0 in | Wt 210.0 lb

## 2019-06-28 DIAGNOSIS — Z114 Encounter for screening for human immunodeficiency virus [HIV]: Secondary | ICD-10-CM

## 2019-06-28 DIAGNOSIS — E559 Vitamin D deficiency, unspecified: Secondary | ICD-10-CM

## 2019-06-28 DIAGNOSIS — E291 Testicular hypofunction: Secondary | ICD-10-CM | POA: Diagnosis not present

## 2019-06-28 DIAGNOSIS — N1831 Chronic kidney disease, stage 3a: Secondary | ICD-10-CM | POA: Diagnosis not present

## 2019-06-28 DIAGNOSIS — Z131 Encounter for screening for diabetes mellitus: Secondary | ICD-10-CM

## 2019-06-28 DIAGNOSIS — F331 Major depressive disorder, recurrent, moderate: Secondary | ICD-10-CM | POA: Diagnosis not present

## 2019-06-28 DIAGNOSIS — F329 Major depressive disorder, single episode, unspecified: Secondary | ICD-10-CM

## 2019-06-28 DIAGNOSIS — N183 Chronic kidney disease, stage 3 unspecified: Secondary | ICD-10-CM

## 2019-06-28 DIAGNOSIS — Z125 Encounter for screening for malignant neoplasm of prostate: Secondary | ICD-10-CM

## 2019-06-28 DIAGNOSIS — Z0001 Encounter for general adult medical examination with abnormal findings: Secondary | ICD-10-CM | POA: Diagnosis not present

## 2019-06-28 DIAGNOSIS — F32A Depression, unspecified: Secondary | ICD-10-CM

## 2019-06-28 HISTORY — DX: Depression, unspecified: F32.A

## 2019-06-28 HISTORY — DX: Chronic kidney disease, stage 3 unspecified: N18.30

## 2019-06-28 HISTORY — DX: Vitamin D deficiency, unspecified: E55.9

## 2019-06-28 HISTORY — DX: Testicular hypofunction: E29.1

## 2019-06-28 MED ORDER — TESTOSTERONE CYPIONATE 200 MG/ML IJ SOLN: 100.0000 mg | INTRAMUSCULAR | 1 refills | Status: DC

## 2019-06-28 NOTE — Progress Notes (Signed)
Chief Complaint: This 48 year old man comes in for an annual physical exam and to address his chronic conditions which are described below. HPI: He has a history of hypogonadism and has been on testosterone therapy for more than a year now and is doing well.  He feels that it is really helped him in terms of his energy levels and depression.  He last took an injection 3 days ago.  He needs a refill on this. He also does suffer from depression and he does follow-up with psychiatry.  He would like to reduce the medications he is on.  He feels that he is fairly stable in terms of his depression at the present time. He also has vitamin D deficiency and he takes vitamin D3 supplementation. He does have an element of stage III chronic kidney disease but this has been stable for a very long time and I have been following this.  Past Medical History:  Diagnosis Date  . CKD (chronic kidney disease) stage 3, GFR 30-59 ml/min 06/28/2019  . Depression 06/28/2019  . Male hypogonadism 06/28/2019  . Vitamin D deficiency disease 06/28/2019   History reviewed. No pertinent surgical history.   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    Social History   Tobacco Use  . Smoking status: Former Research scientist (life sciences)  . Smokeless tobacco: Never Used  Substance Use Topics  . Alcohol use: No    Comment: occ      Allergies: No Known Allergies   Current Meds  Medication Sig  . Cholecalciferol (VITAMIN D-3) 125 MCG (5000 UT) TABS Take 1 tablet by mouth daily.  Marland Kitchen lamoTRIgine (LAMICTAL) 100 MG tablet Take 50 mg by mouth daily.  . sertraline (ZOLOFT) 100 MG tablet Take 100 mg by mouth daily.  Derrill Memo ON 06/29/2019] Testosterone Cypionate 200 MG/ML SOLN Inject 100 mg as directed 2 (two) times a week.  . traZODone (DESYREL) 50 MG tablet Take 50 mg by mouth at bedtime.  . Zinc 50 MG TABS Take 1 tablet by mouth daily.  . [DISCONTINUED] Iron TABS Take 1 tablet  by mouth daily.  . [DISCONTINUED] Testosterone Cypionate 200 MG/ML SOLN Inject 100 mg as directed 2 (two) times a week.     Nutrition He tries to eat reasonably healthy.  He has been entertaining a plant-based diet.  Sleep Adequate.  Exercise He has not exercised for some time but used to go to the gym regularly prior to the pandemic  Bio-identical hormones Testosterone therapy is being used off label for symptoms of testosterone deficiency and benefits that it produces based on several studies.  These benefits include decreasing body fat, increasing in lean muscle mass and increasing in bone density.  There is improvement of memory, cognition.  There is improvement in exercise tolerance and endurance.  Testosterone therapy has also been shown to be protective against coronary artery disease, cerebrovascular disease, diabetes, hypertension and degenerative joint disease. I have discussed with the patient the FDA warnings regarding testosterone therapy, benefits and side effects and modes of administration as well as monitoring blood levels and side effects  on a regular basis The patient is agreeable that testosterone therapy should be an integral part of his/her wellness,quality of life and prevention of chronic disease.  YNW:GNFAO from the symptoms mentioned above,there are no other symptoms referable to all systems reviewed.  Physical Exam: Blood pressure 140/60, pulse 88, height '5\' 10"'$  (1.778 m), weight 210 lb (  95.3 kg), SpO2 98 %. Vitals with BMI 06/28/2019 07/22/2013 07/22/2013  Height '5\' 10"'$  - '5\' 10"'$   Weight 210 lbs - 192 lbs  BMI 89.38 - 10.1  Systolic 751 025 -  Diastolic 60 73 -  Pulse 88 89 -      He looks systemically well. General: Alert, cooperative, and appears to be the stated age.No pallor.  No jaundice.  No clubbing. Head: Normocephalic Eyes: Sclera white, pupils equal and reactive to light, red reflex x 2,  Ears: Normal bilaterally Oral cavity: Lips, mucosa, and  tongue normal: Teeth and gums normal Neck: No adenopathy, supple, symmetrical, trachea midline, and thyroid does not appear enlarged Respiratory: Clear to auscultation bilaterally.No wheezing, crackles or bronchial breathing. Cardiovascular: Heart sounds are present and appear to be normal without murmurs or added sounds.  No carotid bruits.  Peripheral pulses are present and equal bilaterally.: Gastrointestinal:positive bowel sounds, no hepatosplenomegaly.  No masses felt.No tenderness. Skin: Clear, No rashes noted.No worrisome skin lesions seen. Neurological: Grossly intact without focal findings, cranial nerves II through XII intact, muscle strength equal bilaterally Musculoskeletal: No acute joint abnormalities noted.Full range of movement noted with joints. Psychiatric: Affect appropriate, non-anxious.    Assessment  1. Encounter for general adult medical examination with abnormal findings   2. Male hypogonadism   3. Vitamin D deficiency disease   4. Moderate episode of recurrent major depressive disorder (HCC)   5. Stage 3a chronic kidney disease   6. Encounter for screening for HIV   7. Screening for diabetes mellitus   8. Special screening for malignant neoplasm of prostate     Tests Ordered:   Orders Placed This Encounter  Procedures  . HIV antibody (with reflex)  . CBC  . CMP with eGFR([facility])  . Hemoglobin A1c  . Lipid Panel  . PSA  . Testosterone Total,Free,Bio, Males  . Vitamin D, 25-hydroxy  . T3, Free  . TSH     Plan  1. Blood work is ordered as above. 2. He will continue on testosterone therapy and I have refilled his medication today.  Depending on his levels, we may need to adjust the dose. 3. He will continue on vitamin D3 supplementation for vitamin D deficiency. 4. He will continue with his antidepressant medications and these are being adjusted by his psychiatrist. 5. Tdap vaccination was given today. 6. Further recommendations will depend on blood  results and I will see him in about 4 months time for follow-up. 7. Today, in addition to a preventative visit, I performed an office visit to address his chronic conditions above.     Meds ordered this encounter  Medications  . Testosterone Cypionate 200 MG/ML SOLN    Sig: Inject 100 mg as directed 2 (two) times a week.    Dispense:  10 mL    Refill:  1     Airianna Kreischer C Jonel Weldon   06/28/2019, 2:37 PM

## 2019-06-28 NOTE — Patient Instructions (Signed)
[facility] Dietary Recommendations for Weight Loss What to Avoid . Avoid added sugars o Often added sugar can be found in processed foods such as many condiments, dry cereals, cakes, cookies, chips, crisps, crackers, candies, sweetened drinks, etc.  o Read labels and AVOID/DECREASE use of foods with the following in their ingredient list: Sugar, fructose, high fructose corn syrup, sucrose, glucose, maltose, dextrose, molasses, cane sugar, brown sugar, any type of syrup, agave nectar, etc.   . Avoid snacking in between meals . Avoid foods made with flour o If you are going to eat food made with flour, choose those made with whole-grains; and, minimize your consumption as much as is tolerable . Avoid processed foods o These foods are generally stocked in the middle of the grocery store. Focus on shopping on the perimeter of the grocery.  . Avoid Meat  o We recommend following a plant-based diet at [facility]. Thus, we recommend avoiding meat as a general rule. Consider eating beans, legumes, eggs, and/or dairy products for regular protein sources o If you plan on eating meat limit to 4 ounces of meat at a time and choose lean options such as Fish, chicken, turkey. Avoid red meat intake such as pork and/or steak What to Include . Vegetables o GREEN LEAFY VEGETABLES: Kale, spinach, mustard greens, collard greens, cabbage, broccoli, etc. o OTHER: Asparagus, cauliflower, eggplant, carrots, peas, Brussel sprouts, tomatoes, bell peppers, zucchini, beets, cucumbers, etc. . Grains, seeds, and legumes o Beans: kidney beans, black eyed peas, garbanzo beans, black beans, pinto beans, etc. o Whole, unrefined grains: brown rice, barley, bulgur, oatmeal, etc. . Healthy fats  o Avoid highly processed fats such as vegetable oil o Examples of healthy fats: avocado, olives, virgin olive oil, dark chocolate (?72% Cocoa), nuts (peanuts, almonds, walnuts, cashews, pecans, etc.) . None to Low  Intake of Animal Sources of Protein o Meat sources: chicken, turkey, salmon, tuna. Limit to 4 ounces of meat at one time. o Consider limiting dairy sources, but when choosing dairy focus on: PLAIN Greek yogurt, cottage cheese, high-protein milk . Fruit o Choose berries  When to Eat . Intermittent Fasting: o Choosing not to eat for a specific time period, but DO FOCUS ON HYDRATION when fasting o Multiple Techniques: - Time Restricted Eating: eat 3 meals in a day, each meal lasting no more than 60 minutes, no snacks between meals - 16-18 hour fast: fast for 16 to 18 hours up to 7 days a week. Often suggested to start with 2-3 nonconsecutive days per week.  . Remember the time you sleep is counted as fasting.  . Examples of eating schedule: Fast from 7:00pm-11:00am. Eat between 11:00am-7:00pm.  - 24-hour fast: fast for 24 hours up to every other day. Often suggested to start with 1 day per week . Remember the time you sleep is counted as fasting . Examples of eating schedule:  o Eating day: eat 2-3 meals on your eating day. If doing 2 meals, each meal should last no more than 90 minutes. If doing 3 meals, each meal should last no more than 60 minutes. Finish last meal by 7:00pm. o Fasting day: Fast until 7:00pm.  o IF YOU FEEL UNWELL FOR ANY REASON/IN ANY WAY WHEN FASTING, STOP FASTING BY EATING A NUTRITIOUS SNACK OR LIGHT MEAL o ALWAYS FOCUS ON HYDRATION DURING FASTS - Acceptable Hydration sources: water, broths, tea/coffee (black tea/coffee is best but using a small amount of whole-fat dairy products in coffee/tea is acceptable).  -   Poor Hydration Sources: anything with sugar or artificial sweeteners added to it  These recommendations have been developed for patients that are actively receiving medical care from either [facility]. These recommendations are developed for patients with specific medical conditions and are not meant to be  distributed or used by others that are not actively receiving care from either provider listed above at [facility]. It is not appropriate to participate in the above eating plans without proper medical supervision.   Reference: Fung, J. The obesity code. [place]: [place]; 2016.   

## 2019-06-29 LAB — TSH: TSH: 1.55 mIU/L (ref 0.40–4.50)

## 2019-06-29 LAB — CBC
HCT: 42.9 % (ref 38.5–50.0)
Hemoglobin: 15.1 g/dL (ref 13.2–17.1)
MCH: 31.9 pg (ref 27.0–33.0)
MCHC: 35.2 g/dL (ref 32.0–36.0)
MCV: 90.7 fL (ref 80.0–100.0)
MPV: 10.4 fL (ref 7.5–12.5)
Platelets: 219 10*3/uL (ref 140–400)
RBC: 4.73 10*6/uL (ref 4.20–5.80)
RDW: 12.6 % (ref 11.0–15.0)
WBC: 7.6 10*3/uL (ref 3.8–10.8)

## 2019-06-29 LAB — COMPLETE METABOLIC PANEL WITH GFR
AG Ratio: 1.7 (calc) (ref 1.0–2.5)
ALT: 20 U/L (ref 9–46)
AST: 20 U/L (ref 10–40)
Albumin: 4.5 g/dL (ref 3.6–5.1)
Alkaline phosphatase (APISO): 41 U/L (ref 36–130)
BUN/Creatinine Ratio: 12 (calc) (ref 6–22)
BUN: 19 mg/dL (ref 7–25)
CO2: 25 mmol/L (ref 20–32)
Calcium: 9.2 mg/dL (ref 8.6–10.3)
Chloride: 104 mmol/L (ref 98–110)
Creat: 1.58 mg/dL — ABNORMAL HIGH (ref 0.60–1.35)
GFR, Est African American: 60 mL/min/{1.73_m2} (ref >=60)
GFR, Est Non African American: 52 mL/min/{1.73_m2} — ABNORMAL LOW (ref >=60)
Globulin: 2.6 g/dL (calc) (ref 1.9–3.7)
Glucose, Bld: 90 mg/dL (ref 65–99)
Potassium: 4.1 mmol/L (ref 3.5–5.3)
Sodium: 141 mmol/L (ref 135–146)
Total Bilirubin: 0.7 mg/dL (ref 0.2–1.2)
Total Protein: 7.1 g/dL (ref 6.1–8.1)

## 2019-06-29 LAB — LIPID PANEL
Cholesterol: 167 mg/dL (ref <200)
HDL: 25 mg/dL — ABNORMAL LOW (ref >=40)
LDL Cholesterol (Calc): 107 mg/dL (calc) — ABNORMAL HIGH
Non-HDL Cholesterol (Calc): 142 mg/dL (calc) — ABNORMAL HIGH (ref <130)
Total CHOL/HDL Ratio: 6.7 (calc) — ABNORMAL HIGH (ref <5.0)
Triglycerides: 234 mg/dL — ABNORMAL HIGH (ref <150)

## 2019-06-29 LAB — TESTOSTERONE TOTAL,FREE,BIO, MALES
Albumin: 4.5 g/dL (ref 3.6–5.1)
Sex Hormone Binding: 9 nmol/L — ABNORMAL LOW (ref 10–50)
Testosterone, Bioavailable: 178.1 ng/dL (ref >=110.0)
Testosterone, Free: 86.6 pg/mL (ref 46.0–224.0)
Testosterone: 298 ng/dL (ref 250–827)

## 2019-06-29 LAB — VITAMIN D 25 HYDROXY (VIT D DEFICIENCY, FRACTURES): Vit D, 25-Hydroxy: 56 ng/mL (ref 30–100)

## 2019-06-29 LAB — HEMOGLOBIN A1C
Hgb A1c MFr Bld: 5.2 % of total Hgb (ref <5.7)
Mean Plasma Glucose: 103 (calc)
eAG (mmol/L): 5.7 (calc)

## 2019-06-29 LAB — T3, FREE: T3, Free: 3.5 pg/mL (ref 2.3–4.2)

## 2019-06-29 LAB — PSA: PSA: 2.7 ng/mL (ref <=4.0)

## 2019-10-25 ENCOUNTER — Ambulatory Visit (INDEPENDENT_AMBULATORY_CARE_PROVIDER_SITE_OTHER): Payer: Commercial Managed Care - PPO | Admitting: Internal Medicine

## 2019-10-25 ENCOUNTER — Other Ambulatory Visit: Payer: Self-pay

## 2019-10-25 ENCOUNTER — Encounter (INDEPENDENT_AMBULATORY_CARE_PROVIDER_SITE_OTHER): Payer: Self-pay | Admitting: Internal Medicine

## 2019-10-25 VITALS — BP 122/76 | HR 77 | Temp 97.3°F | Resp 18 | Ht 70.0 in | Wt 213.0 lb

## 2019-10-25 DIAGNOSIS — E291 Testicular hypofunction: Secondary | ICD-10-CM | POA: Diagnosis not present

## 2019-10-25 DIAGNOSIS — N1831 Chronic kidney disease, stage 3a: Secondary | ICD-10-CM | POA: Diagnosis not present

## 2019-10-25 MED ORDER — TESTOSTERONE CYPIONATE 200 MG/ML IJ SOLN: 100.0000 mg | INTRAMUSCULAR | 1 refills | Status: DC

## 2019-10-25 NOTE — Progress Notes (Signed)
Metrics: Intervention Frequency ACO  Documented Smoking Status Yearly  Screened one or more times in 24 months  Cessation Counseling or  Active cessation medication Past 24 months  Past 24 months   Guideline developer: UpToDate (See UpToDate for funding source) Date Released: 2014       Wellness Office Visit  Subjective:  Patient ID: [facility], male    DOB: 1970/07/11  Age: 49 y.o. MRN: [id number]  CC: This man comes in for follow-up of chronic kidney disease, hypogonadism, depression. HPI  He sees a psychiatrist and therapist on a regular basis and he has been doing much better and his Lamictal has been discontinued now.  He continues on Zoloft still. As far as his testosterone therapy is concerned, he is not really giving the injections twice a week on a consistent basis which would explain his relatively low testosterone levels on the last visit. He continues to be stable as far as his chronic kidney disease is concerned but I have never referred him to a nephrologist before because he has been so stable. Past Medical History:  Diagnosis Date  . CKD (chronic kidney disease) stage 3, GFR 30-59 ml/min 06/28/2019  . Depression 06/28/2019  . Male hypogonadism 06/28/2019  . Vitamin D deficiency disease 06/28/2019      No family history on file.  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   Social History   Tobacco Use  . Smoking status: Former Research scientist (life sciences)  . Smokeless tobacco: Never Used  Substance Use Topics  . Alcohol use: No    Comment: occ    Current Meds  Medication Sig  . Cholecalciferol (VITAMIN D-3) 125 MCG (5000 UT) TABS Take 1 tablet by mouth daily.  . sertraline (ZOLOFT) 100 MG tablet Take 100 mg by mouth daily.  Derrill Memo ON 10/26/2019] Testosterone Cypionate 200 MG/ML SOLN Inject 100 mg as directed 2 (two) times a week.  . traZODone (DESYREL) 50 MG tablet Take 50 mg by mouth at bedtime.  . Zinc 50 MG  TABS Take 1 tablet by mouth daily.  . [DISCONTINUED] Testosterone Cypionate 200 MG/ML SOLN Inject 100 mg as directed 2 (two) times a week.       Objective:   Today's Vitals: BP 122/76 (BP Location: Left Arm, Patient Position: Sitting, Cuff Size: Normal)   Pulse 77   Temp (!) 97.3 F (36.3 C) (Temporal)   Resp 18   Ht 5\' 10"  (1.778 m)   Wt 213 lb (96.6 kg)   SpO2 97%   BMI 30.56 kg/m  Vitals with BMI 10/25/2019 06/28/2019 07/22/2013  Height 5\' 10"  5\' 10"  -  Weight 213 lbs 210 lbs -  BMI 37.16 96.78 -  Systolic 938 101 751  Diastolic 76 60 73  Pulse 77 88 89     Physical Exam  He looks systemically well.  Blood pressure is excellent.  Weight is stable.  Alert and orientated without any focal neurological signs.     Assessment   1. Stage 3a chronic kidney disease   2. Male hypogonadism       Tests ordered Orders Placed This Encounter  Procedures  . COMPLETE METABOLIC PANEL WITH GFR     Plan: 1. Blood is ordered for his kidney function I think I will refer him to a nephrologist if his blood work is still abnormal. 2. I encouraged him to make sure he takes testosterone injections twice a week on  consistent schedule and then when I see him next time we will check the levels to see if we really need to make any adjustments. 3. Follow-up in 3 months time.   Meds ordered this encounter  Medications  . Testosterone Cypionate 200 MG/ML SOLN    Sig: Inject 100 mg as directed 2 (two) times a week.    Dispense:  10 mL    Refill:  1    Miangel Flom Normajean Glasgow, MD

## 2019-10-26 ENCOUNTER — Other Ambulatory Visit (INDEPENDENT_AMBULATORY_CARE_PROVIDER_SITE_OTHER): Payer: Self-pay | Admitting: Internal Medicine

## 2019-10-26 DIAGNOSIS — N1831 Chronic kidney disease, stage 3a: Secondary | ICD-10-CM

## 2019-10-26 LAB — COMPLETE METABOLIC PANEL WITH GFR
AG Ratio: 1.8 (calc) (ref 1.0–2.5)
ALT: 24 U/L (ref 9–46)
AST: 21 U/L (ref 10–40)
Albumin: 4.7 g/dL (ref 3.6–5.1)
Alkaline phosphatase (APISO): 37 U/L (ref 36–130)
BUN/Creatinine Ratio: 11 (calc) (ref 6–22)
BUN: 19 mg/dL (ref 7–25)
CO2: 29 mmol/L (ref 20–32)
Calcium: 9.9 mg/dL (ref 8.6–10.3)
Chloride: 103 mmol/L (ref 98–110)
Creat: 1.66 mg/dL — ABNORMAL HIGH (ref 0.60–1.35)
GFR, Est African American: 56 mL/min/{1.73_m2} — ABNORMAL LOW (ref >=60)
GFR, Est Non African American: 48 mL/min/{1.73_m2} — ABNORMAL LOW (ref >=60)
Globulin: 2.6 g/dL (calc) (ref 1.9–3.7)
Glucose, Bld: 92 mg/dL (ref 65–99)
Potassium: 4.4 mmol/L (ref 3.5–5.3)
Sodium: 140 mmol/L (ref 135–146)
Total Bilirubin: 0.9 mg/dL (ref 0.2–1.2)
Total Protein: 7.3 g/dL (ref 6.1–8.1)

## 2019-10-31 NOTE — Progress Notes (Signed)
REFERRAL SENT TO Dr Anette Guarneri.

## 2020-02-05 ENCOUNTER — Ambulatory Visit (INDEPENDENT_AMBULATORY_CARE_PROVIDER_SITE_OTHER): Payer: Commercial Managed Care - PPO | Admitting: Internal Medicine

## 2020-03-06 ENCOUNTER — Ambulatory Visit (INDEPENDENT_AMBULATORY_CARE_PROVIDER_SITE_OTHER): Payer: Commercial Managed Care - PPO | Admitting: Internal Medicine

## 2020-03-25 ENCOUNTER — Telehealth (INDEPENDENT_AMBULATORY_CARE_PROVIDER_SITE_OTHER): Payer: Self-pay | Admitting: Nurse Practitioner

## 2020-03-25 ENCOUNTER — Ambulatory Visit (INDEPENDENT_AMBULATORY_CARE_PROVIDER_SITE_OTHER): Payer: Commercial Managed Care - PPO | Admitting: Nurse Practitioner

## 2020-03-25 ENCOUNTER — Encounter (INDEPENDENT_AMBULATORY_CARE_PROVIDER_SITE_OTHER): Payer: Self-pay | Admitting: Nurse Practitioner

## 2020-03-25 ENCOUNTER — Other Ambulatory Visit: Payer: Self-pay

## 2020-03-25 VITALS — BP 126/80 | HR 78 | Temp 97.5°F | Resp 18 | Ht 70.0 in | Wt 214.0 lb

## 2020-03-25 DIAGNOSIS — E559 Vitamin D deficiency, unspecified: Secondary | ICD-10-CM | POA: Diagnosis not present

## 2020-03-25 DIAGNOSIS — E291 Testicular hypofunction: Secondary | ICD-10-CM | POA: Diagnosis not present

## 2020-03-25 DIAGNOSIS — N1831 Chronic kidney disease, stage 3a: Secondary | ICD-10-CM

## 2020-03-25 NOTE — Patient Instructions (Signed)
Dr. Wolfgang Phoenix -- nephrologist. His office should be calling you within the next 2 weeks

## 2020-03-25 NOTE — Telephone Encounter (Signed)
Patient was seen today for office visit. He tells me he would be interested in trying the testosterone cream as opposed to the injection when his next refill is due. He was told that you have to apply the cream daily and would recommend applying to scrotum.

## 2020-03-25 NOTE — Progress Notes (Signed)
Subjective:  Patient ID: [facility], male    DOB: 1970/07/03  Age: 49 y.o. MRN: [id number]  CC:  Chief Complaint  Patient presents with  . Chronic Kidney Disease  . Other    Hypogonadism      HPI  This patient arrives today for the above.  He continues on testosterone therapy and tells me he takes approximately once a week as opposed to twice a week. He is tolerating it well. He does have a history of chronic kidney disease stage IIIa. He was referred to nephrology in the past, but for some reason he tells me he never heard from their office to get this appointment scheduled. He has no acute complaints today, but did tell me he had an episode where he was experiencing some tenderness around his ribs especially with turning and rolling over in bed. This occurred about 2 weeks ago but since has resolved.  Past Medical History:  Diagnosis Date  . CKD (chronic kidney disease) stage 3, GFR 30-59 ml/min 06/28/2019  . Depression 06/28/2019  . Male hypogonadism 06/28/2019  . Vitamin D deficiency disease 06/28/2019      History reviewed. No pertinent family history.  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   Social History   Tobacco Use  . Smoking status: Former Research scientist (life sciences)  . Smokeless tobacco: Never Used  Substance Use Topics  . Alcohol use: No    Comment: occ     Current Meds  Medication Sig  . Cholecalciferol (VITAMIN D-3) 125 MCG (5000 UT) TABS Take 1 tablet by mouth daily.  . sertraline (ZOLOFT) 100 MG tablet Take 100 mg by mouth daily.  . Testosterone Cypionate 200 MG/ML SOLN Inject 100 mg as directed 2 (two) times a week.  . traZODone (DESYREL) 50 MG tablet Take 50 mg by mouth at bedtime.  . Zinc 50 MG TABS Take 1 tablet by mouth daily.    ROS:  Review of Systems  Constitutional: Negative.   Respiratory: Negative.   Cardiovascular: Negative.   Musculoskeletal: Negative.   Neurological: Negative.       Objective:   Today's Vitals: BP 126/80 (BP Location: Left Arm, Patient Position: Sitting, Cuff Size: Normal)   Pulse 78   Temp (!) 97.5 F (36.4 C) (Temporal)   Resp 18   Ht $R'5\' 10"'re$  (1.778 m)   Wt 214 lb (97.1 kg)   SpO2 96%   BMI 30.71 kg/m  Vitals with BMI 03/25/2020 10/25/2019 06/28/2019  Height $Remov'5\' 10"'xTyBjV$  $RemoveB'5\' 10"'tCrYTQmS$  $RemoveBef'5\' 10"'pFVxjnZsBw$   Weight 214 lbs 213 lbs 210 lbs  BMI 30.71 40.98 11.91  Systolic 478 295 621  Diastolic 80 76 60  Pulse 78 77 88     Physical Exam Vitals reviewed.  Constitutional:      Appearance: Normal appearance.  HENT:     Head: Normocephalic and atraumatic.  Cardiovascular:     Rate and Rhythm: Normal rate and regular rhythm.  Pulmonary:     Effort: Pulmonary effort is normal.     Breath sounds: Normal breath sounds.  Musculoskeletal:     Cervical back: Neck supple.  Skin:    General: Skin is warm and dry.  Neurological:     Mental Status: He is alert and oriented to person, place, and time.  Psychiatric:        Mood and Affect: Mood normal.        Behavior: Behavior normal.  Thought Content: Thought content normal.        Judgment: Judgment normal.          Assessment and Plan   1. Stage 3a chronic kidney disease   2. Male hypogonadism   3. Vitamin D deficiency disease      Plan: 1. We will collect metabolic panel today and refer back to nephrology. He was told to call the office if he does not hear from the nephrologist to schedule an appoint within the next 2 weeks. We will also check urine for microalbuminuria. 2. We will check testosterone levels. Patient is interested in trying cream as opposed to injections when next refill is due. We will send note to Dr. Anastasio Champion so he is aware. 3. We will collect serum vitamin D today.  He was told if his symptoms of rib pain come back to let us know so we can evaluate him acutely. Tells me he understands.   Tests ordered Orders Placed This Encounter  Procedures  . Testosterone  Total,Free,Bio, Males  . CMP with eGFR([facility])  . Vitamin D, 25-hydroxy  . Microalbumin/Creatinine Ratio, Urine  . Ambulatory referral to Nephrology      No orders of the defined types were placed in this encounter.   Patient to follow-up in 3 months or sooner as needed.  Ailene Ards, NP

## 2020-03-26 ENCOUNTER — Encounter (INDEPENDENT_AMBULATORY_CARE_PROVIDER_SITE_OTHER): Payer: Self-pay | Admitting: Nurse Practitioner

## 2020-03-26 LAB — COMPLETE METABOLIC PANEL WITH GFR
AG Ratio: 1.8 (calc) (ref 1.0–2.5)
ALT: 20 U/L (ref 9–46)
AST: 20 U/L (ref 10–40)
Albumin: 4.4 g/dL (ref 3.6–5.1)
Alkaline phosphatase (APISO): 38 U/L (ref 36–130)
BUN/Creatinine Ratio: 10 (calc) (ref 6–22)
BUN: 17 mg/dL (ref 7–25)
CO2: 32 mmol/L (ref 20–32)
Calcium: 9.7 mg/dL (ref 8.6–10.3)
Chloride: 102 mmol/L (ref 98–110)
Creat: 1.73 mg/dL — ABNORMAL HIGH (ref 0.60–1.35)
GFR, Est African American: 53 mL/min/{1.73_m2} — ABNORMAL LOW (ref >=60)
GFR, Est Non African American: 46 mL/min/{1.73_m2} — ABNORMAL LOW (ref >=60)
Globulin: 2.5 g/dL (calc) (ref 1.9–3.7)
Glucose, Bld: 87 mg/dL (ref 65–99)
Potassium: 4.3 mmol/L (ref 3.5–5.3)
Sodium: 139 mmol/L (ref 135–146)
Total Bilirubin: 0.6 mg/dL (ref 0.2–1.2)
Total Protein: 6.9 g/dL (ref 6.1–8.1)

## 2020-03-26 LAB — TESTOSTERONE TOTAL,FREE,BIO, MALES
Albumin: 4.4 g/dL (ref 3.6–5.1)
Sex Hormone Binding: 11 nmol/L (ref 10–50)
Testosterone, Bioavailable: 302.6 ng/dL (ref >=110.0)
Testosterone, Free: 150.3 pg/mL (ref 46.0–224.0)
Testosterone: 520 ng/dL (ref 250–827)

## 2020-03-26 LAB — MICROALBUMIN / CREATININE URINE RATIO
Creatinine, Urine: 79 mg/dL (ref 20–320)
Microalb Creat Ratio: 4 mcg/mg creat (ref <30)
Microalb, Ur: 0.3 mg/dL

## 2020-03-26 LAB — VITAMIN D 25 HYDROXY (VIT D DEFICIENCY, FRACTURES): Vit D, 25-Hydroxy: 97 ng/mL (ref 30–100)

## 2020-03-26 NOTE — Telephone Encounter (Signed)
Correct,let him know that he should just call the office and let me know when he is almost out of the injections of Testosterone.

## 2020-05-01 ENCOUNTER — Other Ambulatory Visit (HOSPITAL_COMMUNITY): Payer: Self-pay | Admitting: Nephrology

## 2020-05-01 DIAGNOSIS — N1831 Chronic kidney disease, stage 3a: Secondary | ICD-10-CM

## 2020-05-01 DIAGNOSIS — E559 Vitamin D deficiency, unspecified: Secondary | ICD-10-CM

## 2020-05-06 ENCOUNTER — Ambulatory Visit (HOSPITAL_COMMUNITY)
Admission: RE | Admit: 2020-05-06 | Discharge: 2020-05-06 | Disposition: A | Discharge status: Home / Self Care | Payer: Commercial Managed Care - PPO | Source: Ambulatory Visit | Attending: Nephrology | Admitting: Nephrology

## 2020-05-06 ENCOUNTER — Other Ambulatory Visit: Payer: Self-pay

## 2020-05-06 DIAGNOSIS — E559 Vitamin D deficiency, unspecified: Secondary | ICD-10-CM

## 2020-05-06 DIAGNOSIS — N1831 Chronic kidney disease, stage 3a: Secondary | ICD-10-CM

## 2020-07-02 ENCOUNTER — Ambulatory Visit (INDEPENDENT_AMBULATORY_CARE_PROVIDER_SITE_OTHER): Payer: Commercial Managed Care - PPO | Admitting: Internal Medicine

## 2020-07-02 ENCOUNTER — Encounter (INDEPENDENT_AMBULATORY_CARE_PROVIDER_SITE_OTHER): Payer: Self-pay | Admitting: Internal Medicine

## 2020-07-02 ENCOUNTER — Other Ambulatory Visit: Payer: Self-pay

## 2020-07-02 VITALS — BP 120/82 | HR 77 | Temp 97.1°F | Resp 18 | Ht 70.0 in | Wt 216.0 lb

## 2020-07-02 DIAGNOSIS — F331 Major depressive disorder, recurrent, moderate: Secondary | ICD-10-CM | POA: Diagnosis not present

## 2020-07-02 DIAGNOSIS — E559 Vitamin D deficiency, unspecified: Secondary | ICD-10-CM

## 2020-07-02 DIAGNOSIS — E291 Testicular hypofunction: Secondary | ICD-10-CM | POA: Diagnosis not present

## 2020-07-02 DIAGNOSIS — N1831 Chronic kidney disease, stage 3a: Secondary | ICD-10-CM

## 2020-07-02 NOTE — Progress Notes (Signed)
Metrics: Intervention Frequency ACO  Documented Smoking Status Yearly  Screened one or more times in 24 months  Cessation Counseling or  Active cessation medication Past 24 months  Past 24 months   Guideline developer: UpToDate (See UpToDate for funding source) Date Released: 2014       Wellness Office Visit  Subjective:  Patient ID: [facility], male    DOB: 1970/11/13  Age: 50 y.o. MRN: [id number]  CC: This very pleasant man comes in for follow-up of chronic kidney disease, hypogonadism, depression and vitamin D deficiency. HPI  His nephrologist for some reason, asked him to stop vitamin D3 supplementation. I do not see an elevated calcium level. As far as testosterone therapy is concerned, more recently it has not been consistent because his wife gives the injection to him and her schedule has been quite difficult recently. However in the next few days, her schedule is 90 much improved and he will be able to take testosterone therapy consistently twice a week. He continues on Zoloft for his depression and this is keeping him very stable. Past Medical History:  Diagnosis Date  . CKD (chronic kidney disease) stage 3, GFR 30-59 ml/min (HCC) 06/28/2019  . Depression 06/28/2019  . Male hypogonadism 06/28/2019  . Vitamin D deficiency disease 06/28/2019   History reviewed. No pertinent surgical history.   History reviewed. No pertinent family history.  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   Social History   Tobacco Use  . Smoking status: Former Games developer  . Smokeless tobacco: Never Used  Substance Use Topics  . Alcohol use: No    Comment: occ    Current Meds  Medication Sig  . Cholecalciferol (VITAMIN D-3) 125 MCG (5000 UT) TABS Take 1 tablet by mouth daily.  . sertraline (ZOLOFT) 100 MG tablet Take 100 mg by mouth daily.  . Testosterone Cypionate 200 MG/ML SOLN Inject 100 mg as directed 2 (two) times a week.   . traZODone (DESYREL) 50 MG tablet Take 50 mg by mouth at bedtime.  . Zinc 50 MG TABS Take 1 tablet by mouth daily.      Depression screen [facility] 2/9 07/02/2020  Decreased Interest 0  Down, Depressed, Hopeless 0  PHQ - 2 Score 0     Objective:   Today's Vitals: BP 120/82 (BP Location: Left Arm, Patient Position: Sitting, Cuff Size: Normal)   Pulse 77   Temp (!) 97.1 F (36.2 C) (Temporal)   Resp 18   Ht 5\' 10"  (1.778 m)   Wt 216 lb (98 kg)   SpO2 98%   BMI 30.99 kg/m  Vitals with BMI 07/02/2020 03/25/2020 10/25/2019  Height 5\' 10"  5\' 10"  5\' 10"   Weight 216 lbs 214 lbs 213 lbs  BMI 30.99 30.71 30.56  Systolic 120 126 10/27/2019  Diastolic 82 80 76  Pulse 77 78 77     Physical Exam   He looks systemically well. Remains obese but is muscular and built. Blood pressure is in a good range. Alert and orientated without any focal neurological signs    Assessment   1. Stage 3a chronic kidney disease (HCC)   2. Male hypogonadism   3. Moderate episode of recurrent major depressive disorder (HCC)   4. Vitamin D deficiency disease       Tests ordered No orders of the defined types were placed in this encounter.    Plan: 1. He will continue with all medications  and he will take testosterone therapy on a regular basis now. 2. I will message his nephrologist regarding vitamin D3 supplementation. 3. Follow-up in 3 months for an annual physical and we will do all the blood work then.   No orders of the defined types were placed in this encounter.   Wilson Singer, MD

## 2020-09-30 ENCOUNTER — Encounter (INDEPENDENT_AMBULATORY_CARE_PROVIDER_SITE_OTHER): Payer: Commercial Managed Care - PPO | Admitting: Internal Medicine

## 2021-02-03 IMAGING — US US RENAL
1 series · 14 of 25 positions shown · non-contrast
Comparison: None.

CLINICAL DATA: Initial evaluation for stage III A chronic kidney
disease.

EXAM:
RENAL / URINARY TRACT ULTRASOUND COMPLETE

[Series 1: us renal · 14 of 37 slices shown]
[im 1/37]
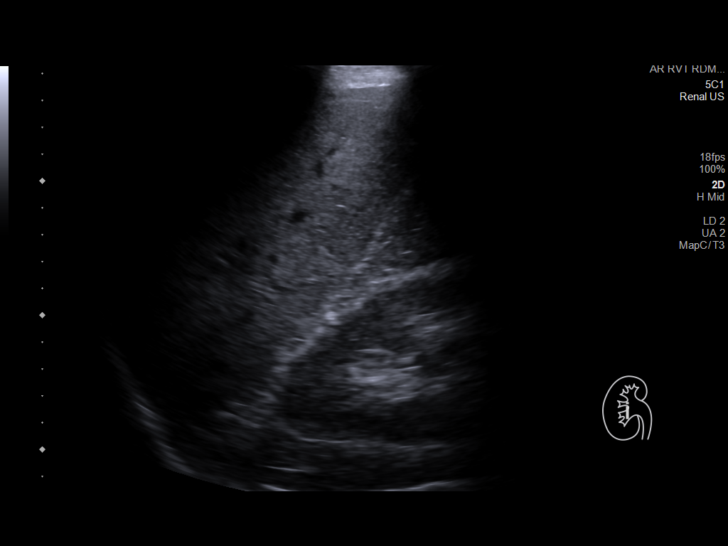
[im 4/37]
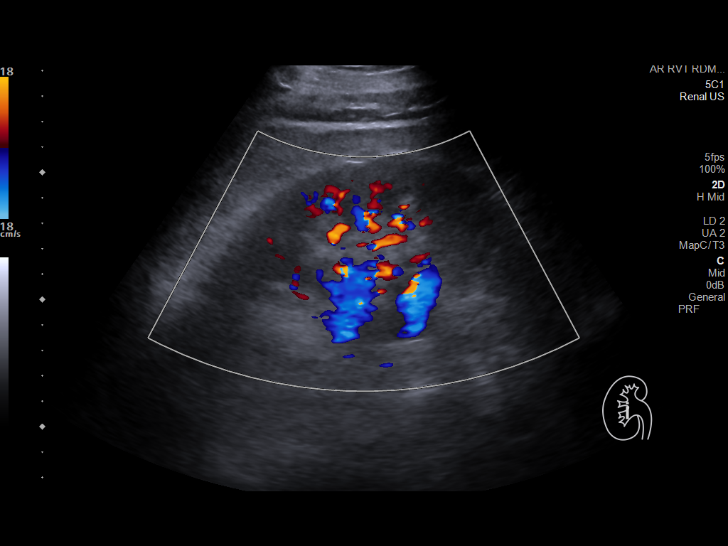
[im 7/37]
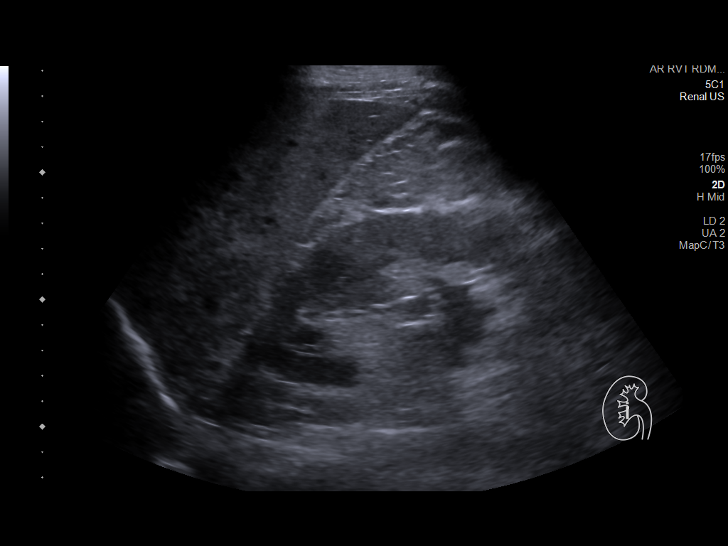
[im 10/37]
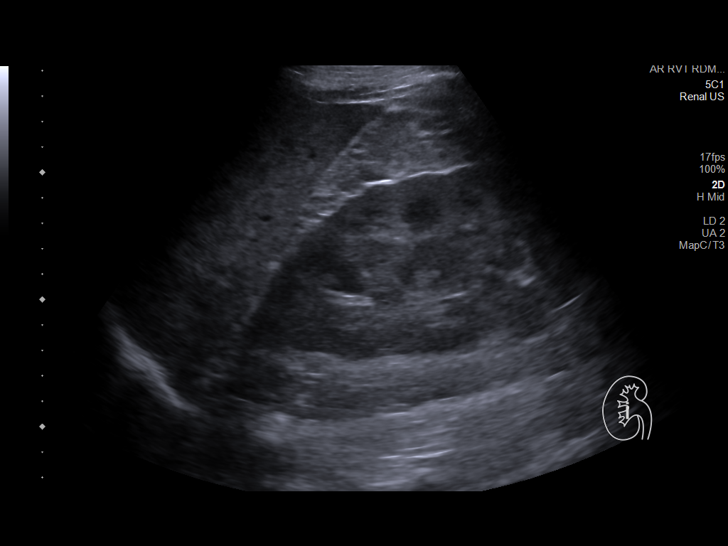
[im 13/37]
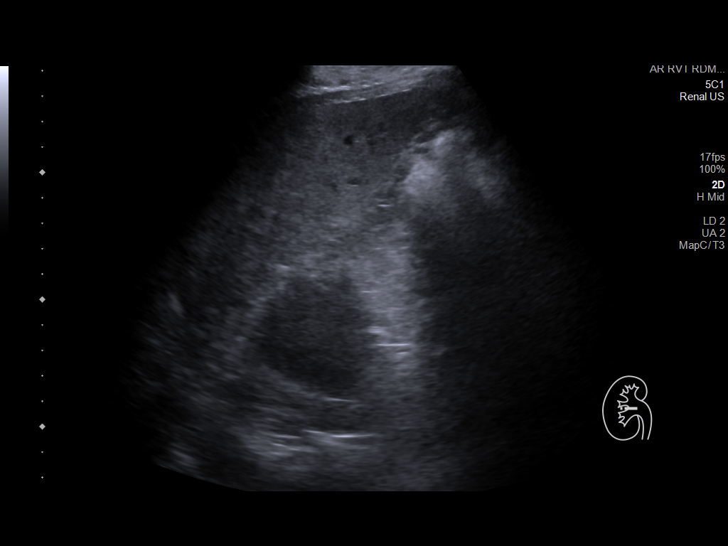
[im 14/37]
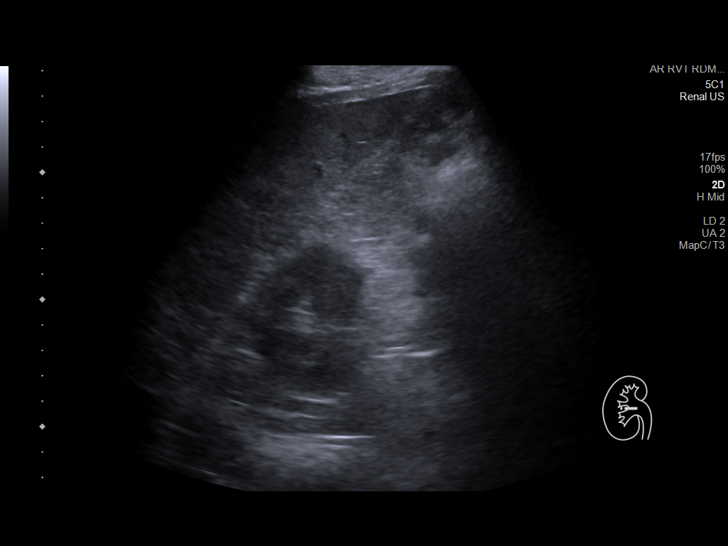
[im 17/37]
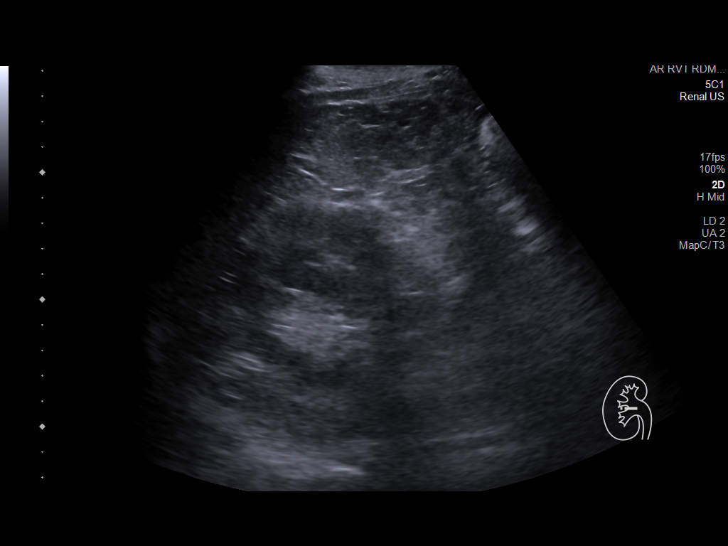
[im 20/37]
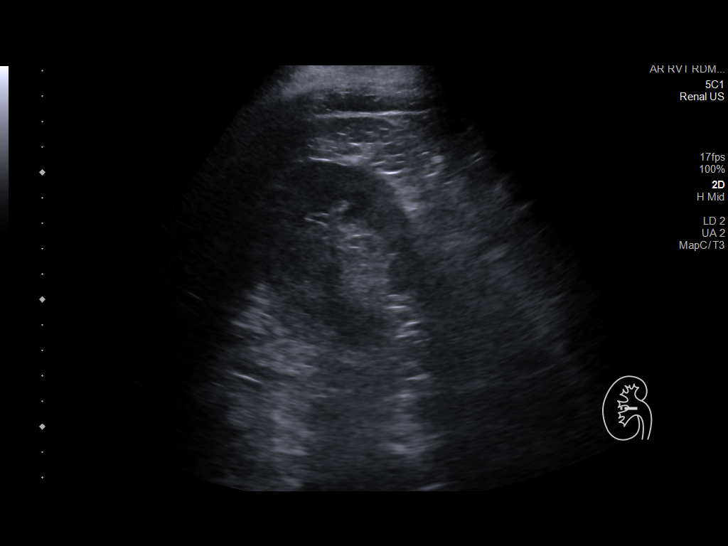
[im 23/37]
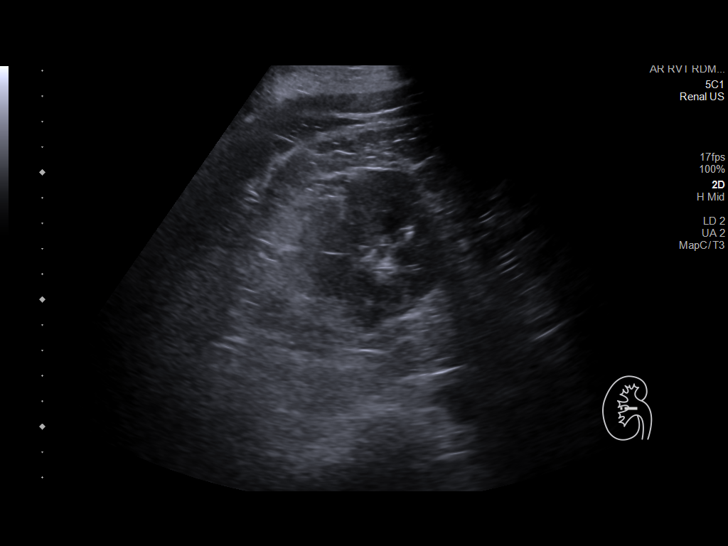
[im 25/37]
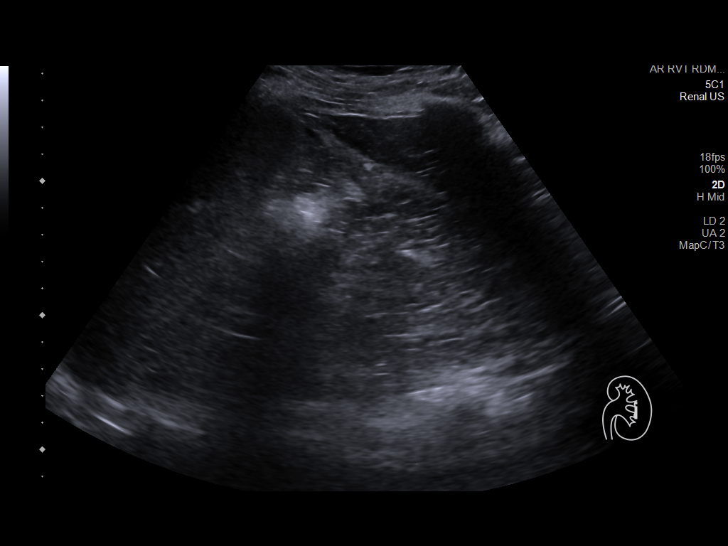
[im 28/37]
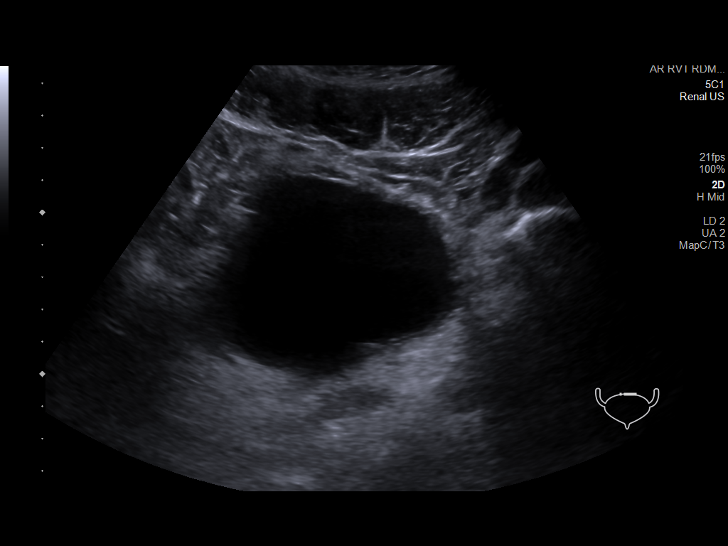
[im 31/37]
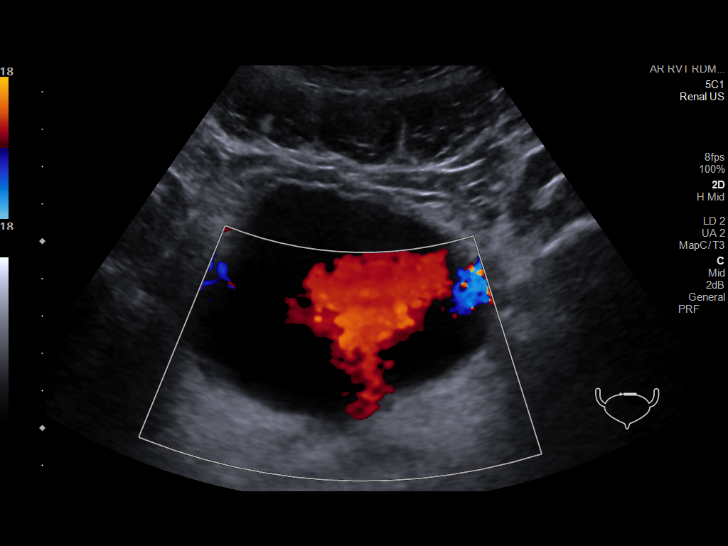
[im 34/37]
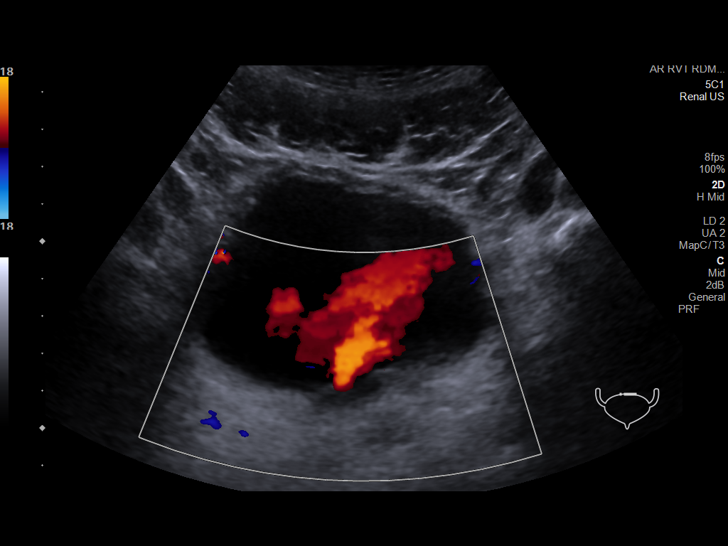
[im 37/37]
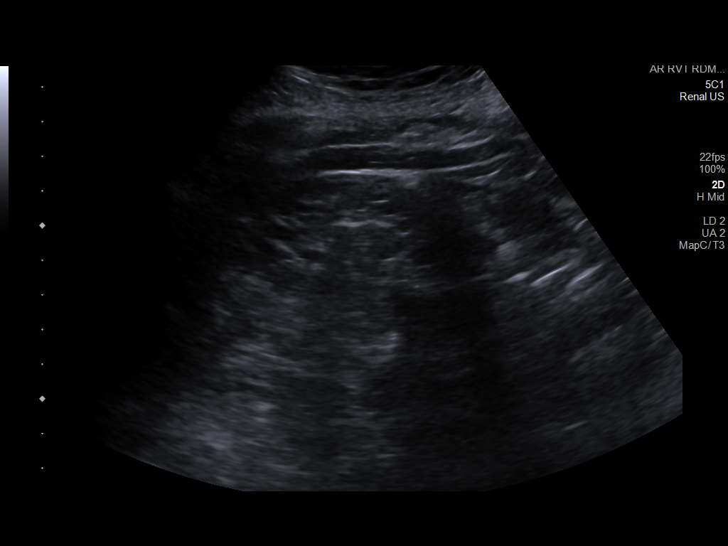

[14 of 25 positions shown; findings below may reference images not displayed]

FINDINGS: Right Kidney:

Renal measurements: 15.0 x 7.4 x 7.6 cm = volume: 438.5 mL. Renal
echogenicity within normal limits. No nephrolithiasis or
hydronephrosis. No focal renal mass.

Left Kidney:

Not visualized, and may be absent.

Bladder:

Appears normal for degree of bladder distention. A right ureteral
jet is visualized. No left jet is seen.

Other:

None.
IMPRESSION: 1. Normal sonographic appearance of the right kidney.
2. Nonvisualization of the left kidney, which may be absent.

## 2022-10-15 DIAGNOSIS — D690 Allergic purpura: Secondary | ICD-10-CM | POA: Insufficient documentation

## 2023-02-19 ENCOUNTER — Encounter: Payer: Self-pay | Admitting: Nurse Practitioner

## 2023-02-19 ENCOUNTER — Ambulatory Visit: Payer: Commercial Managed Care - PPO | Admitting: Nurse Practitioner

## 2023-02-19 VITALS — BP 122/68 | HR 79 | Temp 98.2°F | Ht 70.0 in | Wt 219.0 lb

## 2023-02-19 DIAGNOSIS — G5762 Lesion of plantar nerve, left lower limb: Secondary | ICD-10-CM | POA: Diagnosis not present

## 2023-02-19 DIAGNOSIS — N1831 Chronic kidney disease, stage 3a: Secondary | ICD-10-CM

## 2023-02-19 DIAGNOSIS — Z6831 Body mass index (BMI) 31.0-31.9, adult: Secondary | ICD-10-CM

## 2023-02-19 DIAGNOSIS — E669 Obesity, unspecified: Secondary | ICD-10-CM | POA: Diagnosis not present

## 2023-02-19 DIAGNOSIS — R21 Rash and other nonspecific skin eruption: Secondary | ICD-10-CM | POA: Diagnosis not present

## 2023-02-19 LAB — LIPID PANEL
Cholesterol: 169 mg/dL (ref 0–200)
HDL: 21.7 mg/dL — ABNORMAL LOW (ref >39.00)
NonHDL: 146.99
Total CHOL/HDL Ratio: 8
Triglycerides: 224 mg/dL — ABNORMAL HIGH (ref 0.0–149.0)
VLDL: 44.8 mg/dL — ABNORMAL HIGH (ref 0.0–40.0)

## 2023-02-19 LAB — URINALYSIS, ROUTINE W REFLEX MICROSCOPIC
Bilirubin Urine: NEGATIVE
Hgb urine dipstick: NEGATIVE
Ketones, ur: NEGATIVE
Leukocytes,Ua: NEGATIVE
Nitrite: NEGATIVE
Specific Gravity, Urine: 1.015 (ref 1.000–1.030)
Total Protein, Urine: NEGATIVE
Urine Glucose: 250 — AB
Urobilinogen, UA: 0.2 (ref 0.0–1.0)
pH: 6.5 (ref 5.0–8.0)

## 2023-02-19 LAB — CBC WITH DIFFERENTIAL/PLATELET
Basophils Absolute: 0 10*3/uL (ref 0.0–0.1)
Basophils Relative: 0.6 % (ref 0.0–3.0)
Eosinophils Absolute: 0.2 10*3/uL (ref 0.0–0.7)
Eosinophils Relative: 2.5 % (ref 0.0–5.0)
HCT: 44 % (ref 39.0–52.0)
Hemoglobin: 14.7 g/dL (ref 13.0–17.0)
Lymphocytes Relative: 23.4 % (ref 12.0–46.0)
Lymphs Abs: 1.6 10*3/uL (ref 0.7–4.0)
MCHC: 33.4 g/dL (ref 30.0–36.0)
MCV: 95.5 fl (ref 78.0–100.0)
Monocytes Absolute: 0.4 10*3/uL (ref 0.1–1.0)
Monocytes Relative: 5.5 % (ref 3.0–12.0)
Neutro Abs: 4.5 10*3/uL (ref 1.4–7.7)
Neutrophils Relative %: 68 % (ref 43.0–77.0)
Platelets: 221 10*3/uL (ref 150.0–400.0)
RBC: 4.61 Mil/uL (ref 4.22–5.81)
RDW: 13.1 % (ref 11.5–15.5)
WBC: 6.6 10*3/uL (ref 4.0–10.5)

## 2023-02-19 LAB — MICROALBUMIN / CREATININE URINE RATIO
Creatinine,U: 94.5 mg/dL
Microalb Creat Ratio: 0.7 mg/g (ref 0.0–30.0)
Microalb, Ur: <0.7 mg/dL (ref 0.0–1.9)

## 2023-02-19 LAB — COMPREHENSIVE METABOLIC PANEL
ALT: 24 U/L (ref 0–53)
AST: 23 U/L (ref 0–37)
Albumin: 4.2 g/dL (ref 3.5–5.2)
Alkaline Phosphatase: 41 U/L (ref 39–117)
BUN: 16 mg/dL (ref 6–23)
CO2: 28 mEq/L (ref 19–32)
Calcium: 9.2 mg/dL (ref 8.4–10.5)
Chloride: 106 mEq/L (ref 96–112)
Creatinine, Ser: 1.5 mg/dL (ref 0.40–1.50)
GFR: 53.98 mL/min — ABNORMAL LOW (ref >60.00)
Glucose, Bld: 133 mg/dL — ABNORMAL HIGH (ref 70–99)
Potassium: 3.9 mEq/L (ref 3.5–5.1)
Sodium: 140 mEq/L (ref 135–145)
Total Bilirubin: 0.7 mg/dL (ref 0.2–1.2)
Total Protein: 7.2 g/dL (ref 6.0–8.3)

## 2023-02-19 LAB — TSH: TSH: 1.42 u[IU]/mL (ref 0.35–5.50)

## 2023-02-19 LAB — HEMOGLOBIN A1C: Hgb A1c MFr Bld: 5.4 % (ref 4.6–6.5)

## 2023-02-19 LAB — POCT RAPID STREP A (OFFICE): Rapid Strep A Screen: NEGATIVE

## 2023-02-19 LAB — LDL CHOLESTEROL, DIRECT: Direct LDL: 115 mg/dL

## 2023-02-19 NOTE — Assessment & Plan Note (Signed)
His presentation seems consistent with morton neuroma of left foot. He was encouraged to use supportive shoes with insert to help offload pressure and will refer to podiatry for further evaluation and management recommendations.

## 2023-02-19 NOTE — Assessment & Plan Note (Signed)
Chronic Check labs and refer to nephrology Encouraged proper hydration with water, continued avoidance of NSAIDs, avoid excessive salt intake. He reports understanding.

## 2023-02-19 NOTE — Progress Notes (Signed)
Established Patient Office Visit  Subjective   Patient ID: [facility], male    DOB: 19-Feb-1971  Age: 52 y.o. MRN: [id number]  Chief Complaint  Patient presents with   Establish Care    Pt here for a second opinion, pt has rash on left shin and right elbow. Pt states that his previous pcp said it was strep antibodies and the rash would go away and it still hasn't.     Rash: Has been present for about 6 months. Located mainly to his left shin, but also has a similar rash to right elbow. Was evaluated about 4 months by provider and positive strep antibodies identified. He tried taking a friend's steroids for a bout a week on  his own and noted the rash lightened up for a bit, but otherwise remained unchanged. Rash does not itch or hurt. Has seen dermatology for other conditions in the past but would like to be referred to a different dermatologist.  Forefoot Pain: Has been present for about a month. Located between his left great and 2nd toe. Pain worsens with pressure/standing/walking on it.  CKD stage 3a: Chronic, patient born with 1 kidney. Had a nephrologist in the past, but did not follow-up with them due to anxiety related to possible dialysis in the future. His last eGFR was 55. He avoids NSAIDs and drinks mainly water for hydration. He is normotensive and does not have diabetes.      ROS: see HPI    Objective:     BP 122/68 (BP Location: Left Arm, Patient Position: Sitting, Cuff Size: Normal)   Pulse 79   Temp 98.2 F (36.8 C) (Oral)   Ht 5\' 10"  (1.778 m)   Wt 219 lb (99.3 kg)   SpO2 96%   BMI 31.42 kg/m    Physical Exam Vitals reviewed.  Constitutional:      Appearance: Normal appearance.  HENT:     Head: Normocephalic and atraumatic.  Cardiovascular:     Rate and Rhythm: Normal rate and regular rhythm.     Pulses:          Dorsalis pedis pulses are 2+ on the left side.  Pulmonary:     Effort: Pulmonary effort is normal.     Breath sounds: Normal breath  sounds.  Musculoskeletal:     Cervical back: Neck supple.       Feet:  Skin:    General: Skin is warm and dry.          Comments: Rash that is hyperpigmented with areas that seem nodular/slightly raised. No vesicles, no open/draining areas, nontender, no erythema .  Neurological:     Mental Status: He is alert and oriented to person, place, and time.  Psychiatric:        Mood and Affect: Mood normal.        Behavior: Behavior normal.        Thought Content: Thought content normal.        Judgment: Judgment normal.      No results found for any visits on 02/19/23.    The 10-year ASCVD risk score (Arnett DK, et al., 2019) is: 4.2%    Assessment & Plan:   Problem List Items Addressed This Visit       Nervous and Auditory   Morton neuroma, left    His presentation seems consistent with morton neuroma of left foot. He was encouraged to use supportive shoes with insert to help offload pressure and will refer  to podiatry for further evaluation and management recommendations.       Relevant Orders   Ambulatory referral to Podiatry     Musculoskeletal and Integument   Rash    Chronic, stable, etiology unclear POC strep test today is negative. Will repeat Strep antibody titre, will check ANA. Discussed with supervising physician regarding role of treating with antibiotics if titre come back positive. She feels risk of treating with antibiotic is probably low and may be helpful so may consider this is titre is positive. Will also refer to dermatologist for assistance with evaluation.        Relevant Orders   Ambulatory referral to Dermatology   Antistreptolysin O titer   Antinuclear Antib (ANA)   CBC with Differential/Platelet   Comprehensive metabolic panel   Lipid panel   Hemoglobin A1c   TSH   Microalbumin / creatinine urine ratio   Urinalysis, Routine w reflex microscopic   POCT rapid strep A     Genitourinary   CKD (chronic kidney disease) stage 3, GFR 30-59  ml/min (HCC) - Primary    Chronic Check labs and refer to nephrology Encouraged proper hydration with water, continued avoidance of NSAIDs, avoid excessive salt intake. He reports understanding.       Relevant Orders   Ambulatory referral to Dermatology   Antistreptolysin O titer   Antinuclear Antib (ANA)   CBC with Differential/Platelet   Comprehensive metabolic panel   Lipid panel   Hemoglobin A1c   TSH   Microalbumin / creatinine urine ratio   Urinalysis, Routine w reflex microscopic   Ambulatory referral to Nephrology     Other   Class 1 obesity without serious comorbidity with body mass index (BMI) of 31.0 to 31.9 in adult    Chronic Labs ordered, further recommendations may be made based upon these results       Relevant Orders   Ambulatory referral to Dermatology   Antistreptolysin O titer   Antinuclear Antib (ANA)   CBC with Differential/Platelet   Comprehensive metabolic panel   Lipid panel   Hemoglobin A1c   TSH   Microalbumin / creatinine urine ratio   Urinalysis, Routine w reflex microscopic    Return in about 2 weeks (around 03/05/2023) for 2-4 weeks CPE with Maralyn Sago.    Elenore Paddy, NP

## 2023-02-19 NOTE — Assessment & Plan Note (Signed)
Chronic Labs ordered, further recommendations may be made based upon these results.

## 2023-02-19 NOTE — Assessment & Plan Note (Signed)
Chronic, stable, etiology unclear POC strep test today is negative. Will repeat Strep antibody titre, will check ANA. Discussed with supervising physician regarding role of treating with antibiotics if titre come back positive. She feels risk of treating with antibiotic is probably low and may be helpful so may consider this is titre is positive. Will also refer to dermatologist for assistance with evaluation.

## 2023-02-22 LAB — ANA: Anti Nuclear Antibody (ANA): NEGATIVE

## 2023-02-22 LAB — ANTISTREPTOLYSIN O TITER: ASO: 518 [IU]/mL — ABNORMAL HIGH (ref <200)

## 2023-03-05 ENCOUNTER — Ambulatory Visit: Payer: Commercial Managed Care - PPO | Admitting: Podiatry

## 2023-03-05 DIAGNOSIS — M7751 Other enthesopathy of right foot: Secondary | ICD-10-CM

## 2023-03-05 NOTE — Progress Notes (Signed)
  Subjective:  Patient ID: Christopher Bush, male    DOB: [birth year],  MRN: [id number]  Chief Complaint  Patient presents with   Neuroma    52 y.o. male presents with the above complaint.  Patient presents with left second metatarsophalangeal joint capsulitis painful to touch is progressive gotten worse worse with ambulation worse with pressure.  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  Denies any other acute complaints.  Pain scale 7 out of 10.   Review of Systems: Negative except as noted in the HPI. Denies N/V/F/Ch.  Past Medical History:  Diagnosis Date   CKD (chronic kidney disease) stage 3, GFR 30-59 ml/min (HCC) 06/28/2019   Depression 06/28/2019   Male hypogonadism 06/28/2019   Vitamin D deficiency disease 06/28/2019    Current Outpatient Medications:    sertraline (ZOLOFT) 100 MG tablet, Take 100 mg by mouth daily., Disp: , Rfl:    Testosterone Cypionate 200 MG/ML SOLN, Inject 100 mg as directed 2 (two) times a week., Disp: 10 mL, Rfl: 1   traZODone (DESYREL) 50 MG tablet, Take 50 mg by mouth at bedtime., Disp: , Rfl:   Social History   Tobacco Use  Smoking Status Former   Types: Cigarettes   Passive exposure: Past  Smokeless Tobacco Never    No Known Allergies Objective:  There were no vitals filed for this visit. There is no height or weight on file to calculate BMI. Constitutional Well developed. Well nourished.  Vascular Dorsalis pedis pulses palpable bilaterally. Posterior tibial pulses palpable bilaterally. Capillary refill normal to all digits.  No cyanosis or clubbing noted. Pedal hair growth normal.  Neurologic Normal speech. Oriented to person, place, and time. Epicritic sensation to light touch grossly present bilaterally.  Dermatologic Nails well groomed and normal in appearance. No open wounds. No skin lesions.   Orthopedic: Pain on palpation of right second metatarsophalangeal joint pain with range of motion of the joint.  Mild deep intra-articular pain noted.  No flexor or extensor tendinitis noted.   Radiographs: None Assessment:   1. Capsulitis of metatarsophalangeal (MTP) joint of right foot    Plan:  Patient was evaluated and treated and all questions answered.  Right second metatarsophalangeal joint capsulitis -All questions and concerns were discussed with the patient extensive detail given the amount of pain that he is having he will benefit from steroid injection of decrease inflammatory component associate with pain.  Patient agrees with plan to proceed with steroid injection. -A steroid injection was performed at Right second MPJ using 1% plain Lidocaine and 10 mg of Kenalog. This was well tolerated.   No follow-ups on file.

## 2023-03-19 ENCOUNTER — Ambulatory Visit: Payer: Commercial Managed Care - PPO | Admitting: Nurse Practitioner

## 2023-04-02 ENCOUNTER — Ambulatory Visit: Payer: Commercial Managed Care - PPO | Admitting: Podiatry

## 2023-04-02 ENCOUNTER — Encounter: Payer: Commercial Managed Care - PPO | Admitting: Nurse Practitioner

## 2023-04-15 DIAGNOSIS — Q600 Renal agenesis, unilateral: Secondary | ICD-10-CM | POA: Insufficient documentation

## 2023-04-16 ENCOUNTER — Encounter: Payer: Commercial Managed Care - PPO | Admitting: Nurse Practitioner

## 2023-04-30 ENCOUNTER — Ambulatory Visit: Payer: Commercial Managed Care - PPO | Admitting: Nurse Practitioner

## 2023-07-29 ENCOUNTER — Encounter: Payer: Self-pay | Admitting: Dermatology

## 2023-07-29 ENCOUNTER — Ambulatory Visit: Payer: Commercial Managed Care - PPO | Admitting: Dermatology

## 2023-07-29 VITALS — BP 142/92 | HR 96

## 2023-07-29 DIAGNOSIS — R21 Rash and other nonspecific skin eruption: Secondary | ICD-10-CM | POA: Diagnosis not present

## 2023-07-29 DIAGNOSIS — Z8619 Personal history of other infectious and parasitic diseases: Secondary | ICD-10-CM | POA: Diagnosis not present

## 2023-07-29 DIAGNOSIS — L280 Lichen simplex chronicus: Secondary | ICD-10-CM

## 2023-07-29 DIAGNOSIS — J020 Streptococcal pharyngitis: Secondary | ICD-10-CM

## 2023-07-29 NOTE — Progress Notes (Unsigned)
   New Patient Visit   Subjective  Christopher Bush is a 53 y.o. male who presents for the following: Rash  Patient states he  has rash located at the scattered throughout his body that would like to have examined. Patient reports the areas have been there for 1 years. He reports the areas are bothersome. He states the area can be itchy. Patient rates irritation 2 out of 10. He states that the areas have spread. Patient reports he  has previously been treated for these areas (he was prescribed Mometasone which did not help with his sxs). Patient denies Hx of bx. Patient denies family history of skin cancer(s).  The following portions of the chart were reviewed this encounter and updated as appropriate: medications, allergies, medical history  Review of Systems:  No other skin or systemic complaints except as noted in HPI or Assessment and Plan.  Objective  Well appearing patient in no apparent distress; mood and affect are within normal limits.   A focused examination was performed of the following areas: LLE, Elbows and fingers  Relevant exam findings are noted in the Assessment and Plan.                   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  Documentation: I have reviewed the above documentation for accuracy and completeness, and I agree with the above.  Stasia Cavalier, am acting as scribe for Langston Reusing, DO.  Langston Reusing, DO

## 2023-07-29 NOTE — Patient Instructions (Addendum)
Hello Christopher Bush,  Thank you for visiting [place] today.  Here is a summary of the key instructions from today's consultation:  Diagnosis: Lichen Aureus   Medications:   Mometasone: Start with applying Mometasone twice a day for the first two weeks.   Tacrolimus: After the initial two weeks, switch to Tacrolimus, applying it twice a day for another two weeks. Continue alternating between Mometasone and Tacrolimus for a couple of months.   Antibiotics: Begin an empirical course of Amoxicillin, taking it twice a day for 14 days.   Probiotic: Take a probiotic alongside the antibiotics to maintain gut health.  Lifestyle and Care:   Moisturizer: Consider using DermMend moisturizer, which contains Arnica and Vitamin K, for cosmetic improvement if the appearance is bothersome.  Follow-Up Appointment:   We will see you again in three months to monitor progress and possibly adjust your treatment plan. We are looking forward to seeing the improvement in your condition.  I have sent the prescriptions to your pharmacy. If you have any questions or concerns before our next visit, please do not hesitate to contact our office.  Warm regards,  Dr. Langston Reusing,  Dermatology     Important Information   Due to recent changes in healthcare laws, you may see results of your pathology and/or laboratory studies on MyChart before the doctors have had a chance to review them. We understand that in some cases there may be results that are confusing or concerning to you. Please understand that not all results are received at the same time and often the doctors may need to interpret multiple results in order to provide you with the best plan of care or course of treatment. Therefore, we ask that you please give [place] 2 business days to thoroughly review all your results before contacting the office for clarification. Should we see a critical lab result, you will be contacted sooner.     If You Need Anything After Your  Visit   If you have any questions or concerns for your doctor, please call our main line at [phone]. If no one answers, please leave a voicemail as directed and we will return your call as soon as possible. Messages left after 4 pm will be answered the following business day.    You may also send [place] a message via MyChart. We typically respond to MyChart messages within 1-2 business days.  For prescription refills, please ask your pharmacy to contact our office. Our fax number is [phone].  If you have an urgent issue when the clinic is closed that cannot wait until the next business day, you can page your doctor at the number below.     Please note that while we do our best to be available for urgent issues outside of office hours, we are not available 24/7.    If you have an urgent issue and are unable to reach [place], you may choose to seek medical care at your doctor's office, retail clinic, urgent care center, or emergency room.   If you have a medical emergency, please immediately call 911 or go to the emergency department. In the event of inclement weather, please call our main line at [phone] for an update on the status of any delays or closures.  Dermatology Medication Tips: Please keep the boxes that topical medications come in in order to help keep track of the instructions about where and how to use these. Pharmacies typically print the medication instructions only on the boxes and not  directly on the medication tubes.   If your medication is too expensive, please contact our office at [phone] or send [place] a message through MyChart.    We are unable to tell what your co-pay for medications will be in advance as this is different depending on your insurance coverage. However, we may be able to find a substitute medication at lower cost or fill out paperwork to get insurance to cover a needed medication.    If a prior authorization is required to get your medication  covered by your insurance company, please allow [place] 1-2 business days to complete this process.   Drug prices often vary depending on where the prescription is filled and some pharmacies may offer cheaper prices.   The website www.goodrx.com contains coupons for medications through different pharmacies. The prices here do not account for what the cost may be with help from insurance (it may be cheaper with your insurance), but the website can give you the price if you did not use any insurance.  - You can print the associated coupon and take it with your prescription to the pharmacy.  - You may also stop by our office during regular business hours and pick up a GoodRx coupon card.  - If you need your prescription sent electronically to a different pharmacy, notify our office through [facility] or by phone at [phone]

## 2023-07-30 MED ORDER — TACROLIMUS 0.1 % EX OINT: TOPICAL_OINTMENT | Freq: Two times a day (BID) | CUTANEOUS | 3 refills | Status: AC

## 2023-07-30 MED ORDER — MOMETASONE FUROATE 0.1 % EX OINT: TOPICAL_OINTMENT | Freq: Two times a day (BID) | CUTANEOUS | 3 refills | Status: AC

## 2023-07-30 MED ORDER — AMOXICILLIN 500 MG PO TABS
500.0000 mg | ORAL_TABLET | Freq: Two times a day (BID) | ORAL | 0 refills | Status: DC
Start: 2023-07-30 — End: 2023-11-29

## 2023-08-16 LAB — EXTERNAL GENERIC LAB PROCEDURE: COLOGUARD: NEGATIVE

## 2023-10-28 ENCOUNTER — Encounter: Payer: Self-pay | Admitting: Dermatology

## 2023-10-28 ENCOUNTER — Ambulatory Visit: Payer: Commercial Managed Care - PPO | Admitting: Dermatology

## 2023-10-28 VITALS — BP 103/67 | HR 80

## 2023-10-28 DIAGNOSIS — B353 Tinea pedis: Secondary | ICD-10-CM

## 2023-10-28 DIAGNOSIS — R21 Rash and other nonspecific skin eruption: Secondary | ICD-10-CM

## 2023-10-28 DIAGNOSIS — L814 Other melanin hyperpigmentation: Secondary | ICD-10-CM

## 2023-10-28 DIAGNOSIS — C44519 Basal cell carcinoma of skin of other part of trunk: Secondary | ICD-10-CM

## 2023-10-28 DIAGNOSIS — D492 Neoplasm of unspecified behavior of bone, soft tissue, and skin: Secondary | ICD-10-CM | POA: Diagnosis not present

## 2023-10-28 DIAGNOSIS — D485 Neoplasm of uncertain behavior of skin: Secondary | ICD-10-CM

## 2023-10-28 DIAGNOSIS — L821 Other seborrheic keratosis: Secondary | ICD-10-CM | POA: Diagnosis not present

## 2023-10-28 DIAGNOSIS — Z808 Family history of malignant neoplasm of other organs or systems: Secondary | ICD-10-CM

## 2023-10-28 DIAGNOSIS — D1801 Hemangioma of skin and subcutaneous tissue: Secondary | ICD-10-CM

## 2023-10-28 DIAGNOSIS — L578 Other skin changes due to chronic exposure to nonionizing radiation: Secondary | ICD-10-CM

## 2023-10-28 DIAGNOSIS — Z139 Encounter for screening, unspecified: Secondary | ICD-10-CM

## 2023-10-28 DIAGNOSIS — Z1283 Encounter for screening for malignant neoplasm of skin: Secondary | ICD-10-CM

## 2023-10-28 DIAGNOSIS — D229 Melanocytic nevi, unspecified: Secondary | ICD-10-CM

## 2023-10-28 DIAGNOSIS — L570 Actinic keratosis: Secondary | ICD-10-CM

## 2023-10-28 DIAGNOSIS — W908XXA Exposure to other nonionizing radiation, initial encounter: Secondary | ICD-10-CM

## 2023-10-28 MED ORDER — CLOTRIMAZOLE-BETAMETHASONE 1-0.05 % EX CREA: 1.0000 | TOPICAL_CREAM | Freq: Two times a day (BID) | CUTANEOUS | 5 refills | Status: AC

## 2023-10-28 NOTE — Patient Instructions (Addendum)
 Hello Christopher Bush,  Thank you for visiting today. Here is a summary of the key instructions:  - Medications:   - Continue using Derm-Mend cream as directed   - Use clotrimazole /betamethasone  cream twice a day for a month on affected areas of feet  - Supplements:   - Consider taking pure Arnica or Arnica supplements (photos below)  - Diagnostic Tests:   - Biopsy will be performed during this visit   - Lab work to be done:     - C-ANCA     - P-ANCA     - Anti-Scl     - Anti-Jo  - Follow-up:   - Return in 3 months for a follow-up appointment   - Schedule a full head-to-toe exam next year  Please reach out if you have any questions or concerns.  Warm regards,  Dr. Louana Roup, Dermatology        Patient Handout: Wound Care for Skin Biopsy Site  Taking Care of Your Skin Biopsy Site  Proper care of the biopsy site is essential for promoting healing and minimizing scarring. This handout provides instructions on how to care for your biopsy site to ensure optimal recovery.  1. Cleaning the Wound:  Clean the biopsy site daily with gentle soap and water. Gently pat the area dry with a clean, soft towel. Avoid harsh scrubbing or rubbing the area, as this can irritate the skin and delay healing.  2. Applying Aquaphor and Bandage:  After cleaning the wound, apply a thin layer of Aquaphor ointment to the biopsy site. Cover the area with a sterile bandage to protect it from dirt, bacteria, and friction. Change the bandage daily or as needed if it becomes soiled or wet.  3. Continued Care for One Week:  Repeat the cleaning, Aquaphor application, and bandaging process daily for one week following the biopsy procedure. Keeping the wound clean and moist during this initial healing period will help prevent infection and promote optimal healing.  4. Massaging Aquaphor into the Area:  ---After one week, discontinue the use of bandages but continue to apply Aquaphor to the  biopsy site. ----Gently massage the Aquaphor into the area using circular motions. ---Massaging the skin helps to promote circulation and prevent the formation of scar tissue.   Additional Tips:  Avoid exposing the biopsy site to direct sunlight during the healing process, as this can cause hyperpigmentation or worsen scarring. If you experience any signs of infection, such as increased redness, swelling, warmth, or drainage from the wound, contact your healthcare provider immediately. Follow any additional instructions provided by your healthcare provider for caring for the biopsy site and managing any discomfort. Conclusion:  Taking proper care of your skin biopsy site is crucial for ensuring optimal healing and minimizing scarring. By following these instructions for cleaning, applying Aquaphor, and massaging the area, you can promote a smooth and successful recovery. If you have any questions or concerns about caring for your biopsy site, don't hesitate to contact your healthcare provider for guidance.    Important Information   Due to recent changes in healthcare laws, you may see results of your pathology and/or laboratory studies on MyChart before the doctors have had a chance to review them. We understand that in some cases there may be results that are confusing or concerning to you. Please understand that not all results are received at the same time and often the doctors may need to interpret multiple results in order to provide you with the best  plan of care or course of treatment. Therefore, we ask that you please give us  2 business days to thoroughly review all your results before contacting the office for clarification. Should we see a critical lab result, you will be contacted sooner.     If You Need Anything After Your Visit   If you have any questions or concerns for your doctor, please call our main line at [phone]. If no one answers, please leave a voicemail as directed  and we will return your call as soon as possible. Messages left after 4 pm will be answered the following business day.    You may also send us  a message via MyChart. We typically respond to MyChart messages within 1-2 business days.  For prescription refills, please ask your pharmacy to contact our office. Our fax number is [phone].  If you have an urgent issue when the clinic is closed that cannot wait until the next business day, you can page your doctor at the number below.     Please note that while we do our best to be available for urgent issues outside of office hours, we are not available 24/7.    If you have an urgent issue and are unable to reach us , you may choose to seek medical care at your doctor's office, retail clinic, urgent care center, or emergency room.   If you have a medical emergency, please immediately call 911 or go to the emergency department. In the event of inclement weather, please call our main line at [phone] for an update on the status of any delays or closures.  Dermatology Medication Tips: Please keep the boxes that topical medications come in in order to help keep track of the instructions about where and how to use these. Pharmacies typically print the medication instructions only on the boxes and not directly on the medication tubes.   If your medication is too expensive, please contact our office at [phone] or send us  a message through MyChart.    We are unable to tell what your co-pay for medications will be in advance as this is different depending on your insurance coverage. However, we may be able to find a substitute medication at lower cost or fill out paperwork to get insurance to cover a needed medication.    If a prior authorization is required to get your medication covered by your insurance company, please allow us  1-2 business days to complete this process.   Drug prices often vary depending on where the prescription is filled  and some pharmacies may offer cheaper prices.   The website www.goodrx.com contains coupons for medications through different pharmacies. The prices here do not account for what the cost may be with help from insurance (it may be cheaper with your insurance), but the website can give you the price if you did not use any insurance.  - You can print the associated coupon and take it with your prescription to the pharmacy.  - You may also stop by our office during regular business hours and pick up a GoodRx coupon card.  - If you need your prescription sent electronically to a different pharmacy, notify our office through [facility] or by phone at [phone]

## 2023-10-28 NOTE — Progress Notes (Unsigned)
 Total Body Skin Exam (TBSE) Visit   Subjective  Christopher Bush is a 53 y.o. male who presents for the following: Skin Cancer Screening and Full Body Skin Exam  Patient presents today for follow up visit for TBSE. Patient was last evaluated on 07/29/23 . Patient denies medication changes. Patient reports he  does have spots, moles and lesions of concern to be evaluated. Patient reports throughout his lifetime he  has had moderate sun exposure. Currently, patient reports if he  has excessive sun exposure, he  does apply sunscreen and/or wears protective coverings. Patient reports he  has hx of bx. Patient reports  family history of skin cancers (Mom). The patient has spots, moles and lesions to be evaluated, some may be new or changing and the patient has concerns that these could be cancer.  The following portions of the chart were reviewed this encounter and updated as appropriate: medications, allergies, medical history  Review of Systems:  No other skin or systemic complaints except as noted in HPI or Assessment and Plan.  Objective  Well appearing patient in no apparent distress; mood and affect are within normal limits.  A full examination was performed including scalp, head, eyes, ears, nose, lips, neck, chest, axillae, abdomen, back, buttocks, bilateral upper extremities, bilateral lower extremities, hands, feet, fingers, toes, fingernails, and toenails. All findings within normal limits unless otherwise noted below.   Relevant physical exam findings are noted in the Assessment and Plan.                                            Assessment & Plan   LENTIGINES, SEBORRHEIC KERATOSES, HEMANGIOMAS - Benign normal skin lesions - Benign-appearing - Call for any changes  MELANOCYTIC NEVI - Tan-brown and/or pink-flesh-colored symmetric macules and papules - Benign appearing on exam today - Observation - Call clinic for new or changing moles - Recommend daily use of  broad spectrum spf 30+ sunscreen to sun-exposed areas.   ACTINIC DAMAGE - Chronic condition, secondary to cumulative UV/sun exposure - diffuse scaly erythematous macules with underlying dyspigmentation - Recommend daily broad spectrum sunscreen SPF 30+ to sun-exposed areas, reapply every 2 hours as needed.  - Staying in the shade or wearing long sleeves, sun glasses (UVA+UVB protection) and wide brim hats (4-inch brim around the entire circumference of the hat) are also recommended for sun protection.  - Call for new or changing lesions.  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 Patient was prescribed mometasone  and tacrolimus , alternating every 2 weeks, which he reports still using. New non-pruritic spots appeared a couple of months ago. Spots tend to appear on dependent areas like elbows due to trauma to superficial blood vessels, potentially triggered by frequent hand use at work. Brown color attributed to iron from burst blood vessels.  - Plan:    Continue using Derm-Mend cream    Consider using pure Arnica or Arnica supplements    Perform skin biopsy for definitive diagnosis    Order lab work to rule out autoimmune issues:     - C-ANCA     - P-ANCA     - Anti-Scl     -  Anti-Jo    Follow-up in 3 months   TINEA PEDIS Exam: Scaling and maceration web spaces and over distal and lateral soles.  Treatment Plan: - We will prescribe Clotrimazole -betamethasone  cream to apply 2 times daily until areas clear  SKIN CANCER SCREENING PERFORMED TODAY.    No follow-ups on file.  I, Jetta Ager, am acting as Neurosurgeon for [employer], DO.  Documentation: I have reviewed the above documentation for accuracy and completeness, and I agree with the  above.  Louana Roup, DO

## 2023-11-04 LAB — SURGICAL PATHOLOGY

## 2023-11-04 LAB — ANTI-JO 1 ANTIBODY, IGG: Anti JO-1: <0.2 AI (ref 0.0–0.9)

## 2023-11-04 LAB — SPECIMEN STATUS REPORT

## 2023-11-10 LAB — ANCA PROFILE
Anti-MPO Antibodies: <0.2 U (ref 0.0–0.9)
Anti-PR3 Antibodies: <0.2 U (ref 0.0–0.9)
Atypical pANCA: <1:20 {titer}
C-ANCA: <1:20 {titer}
P-ANCA: <1:20 {titer}

## 2023-11-10 LAB — ANA,IFA RA DIAG PNL W/RFLX TIT/PATN
ANA Titer 1: NEGATIVE
Cyclic Citrullin Peptide Ab: 14 U (ref 0–19)
Rheumatoid fact SerPl-aCnc: <10 [IU]/mL (ref <14.0)

## 2023-11-10 LAB — ANTI-SCLERODERMA ANTIBODY: Scleroderma (Scl-70) (ENA) Antibody, IgG: <0.2 AI (ref 0.0–0.9)

## 2023-11-10 LAB — SPECIMEN STATUS REPORT

## 2023-11-11 ENCOUNTER — Ambulatory Visit: Payer: Self-pay | Admitting: Dermatology

## 2023-11-29 ENCOUNTER — Ambulatory Visit: Admitting: Dermatology

## 2023-11-29 ENCOUNTER — Encounter: Payer: Self-pay | Admitting: Dermatology

## 2023-11-29 VITALS — BP 138/87 | HR 91 | Temp 98.3°F

## 2023-11-29 DIAGNOSIS — C4491 Basal cell carcinoma of skin, unspecified: Secondary | ICD-10-CM

## 2023-11-29 DIAGNOSIS — C44519 Basal cell carcinoma of skin of other part of trunk: Secondary | ICD-10-CM

## 2023-11-29 NOTE — Progress Notes (Signed)
 Follow-Up Visit   Subjective  Christopher Bush is a 53 y.o. 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 Myrtie Atkinson.  The following portions of the chart were reviewed this encounter and updated as appropriate: medications, allergies, medical history  Review of Systems:  No other skin or systemic complaints except as noted in HPI or Assessment and Plan.  Objective  Well appearing patient in no apparent distress; mood and affect are within normal limits.  A focused examination was performed of the following areas: Upper back, right posterior lateral Relevant physical exam findings are noted in the Assessment and Plan.   Upper back, right posterior lateral Healing biopsy site   Assessment & Plan   BASAL CELL CARCINOMA (BCC), UNSPECIFIED SITE Upper back, right posterior lateral Skin excision  Excision method:  elliptical Lesion length (cm):  1.1 Lesion width (cm):  1 Margin per side (cm):  0.5 Total excision diameter (cm):  2.1 Informed consent: discussed and consent obtained   Timeout: patient name, date of birth, surgical site, and procedure verified   Procedure prep:  Patient was prepped and draped in usual sterile fashion Prep type:  Chlorhexidine Anesthesia: the lesion was anesthetized in a standard fashion   Anesthetic:  1% lidocaine w/ epinephrine 1-100,000 buffered w/ 8.4% NaHCO3 Instrument used: #15 blade   Hemostasis achieved with: suture, pressure and electrodesiccation   Outcome: patient tolerated procedure well with no complications   Post-procedure details: sterile dressing applied and wound care instructions given   Dressing type: pressure dressing and petrolatum    Skin repair Complexity:  Complex Final length (cm):  5.2 Informed consent: discussed and consent obtained   Timeout: patient name, date of birth, surgical site, and procedure verified    Procedure prep:  Patient was prepped and draped in usual sterile fashion Prep type:  Chlorhexidine Anesthesia: the lesion was anesthetized in a standard fashion   Anesthetic:  1% lidocaine w/ epinephrine 1-100,000 buffered w/ 8.4% NaHCO3 Reason for type of repair: reduce tension to allow closure, preserve normal anatomy, preserve normal anatomical and functional relationships, avoid adjacent structures and allow side-to-side closure without requiring a flap or graft   Undermining: area extensively undermined   Subcutaneous layers (deep stitches):  Suture size:  3-0 Suture type: PDS (polydioxanone)   Stitches:  Buried vertical mattress Fine/surface layer approximation (top stitches):  Suture type: cyanoacrylate tissue glue   Hemostasis achieved with: suture, pressure and electrodesiccation Outcome: patient tolerated procedure well with no complications   Post-procedure details: sterile dressing applied and wound care instructions given   Dressing type: pressure dressing   Specimen 1 - Surgical pathology Differential Diagnosis: BCC [phone] Check Margins: No  The surgical wound was then cleaned, prepped, and re-anesthetized as above. Wound edges were undermined extensively along at least one entire edge and at a distance equal to or greater than the width of the defect (see wound defect size above) in order to achieve closure and decrease wound tension and anatomic distortion. Redundant tissue repair including standing cone removal was performed. Hemostasis was achieved with electrocautery. Subcutaneous and epidermal tissues were approximated with the above sutures. The surgical site was then lightly scrubbed with sterile, saline-soaked gauze. Steri-strips were applied, and the area was then bandaged using Vaseline ointment, non-adherent gauze, gauze pads, and tape to provide an adequate pressure dressing. The patient tolerated the procedure well, was given detailed written and verbal  wound care instructions, and was discharged in good condition.   The patient will follow-up: PRN.  Return if symptoms worsen or fail to improve.   Documentation: I have reviewed the above documentation for accuracy and completeness, and I agree with the above.  Deneise Finlay, MD

## 2023-11-29 NOTE — Patient Instructions (Signed)

## 2023-12-01 LAB — SURGICAL PATHOLOGY

## 2023-12-02 ENCOUNTER — Ambulatory Visit: Payer: Self-pay | Admitting: Dermatology

## 2024-02-02 ENCOUNTER — Ambulatory Visit: Admitting: Podiatry

## 2024-02-09 ENCOUNTER — Ambulatory Visit: Admitting: Dermatology
